# Patient Record
Sex: Male | Born: 1990 | Race: White | Hispanic: No | Marital: Single | Smoking: Never smoker
Health system: Southern US, Community
[De-identification: ages and names within clinical notes are randomized; demographics above are authoritative.]

## PROBLEM LIST (undated history)

## (undated) HISTORY — PX: TONSILLECTOMY: SUR1361

---
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
## 2012-10-31 ENCOUNTER — Emergency Department (HOSPITAL_COMMUNITY)
Admission: EM | Admit: 2012-10-31 | Discharge: 2012-10-31 | Disposition: A | Discharge status: Home / Self Care | Payer: Self-pay | Attending: Emergency Medicine | Admitting: Emergency Medicine

## 2012-10-31 ENCOUNTER — Emergency Department (HOSPITAL_COMMUNITY): Payer: Self-pay

## 2012-10-31 ENCOUNTER — Encounter (HOSPITAL_COMMUNITY): Payer: Self-pay | Admitting: *Deleted

## 2012-10-31 ENCOUNTER — Telehealth (HOSPITAL_COMMUNITY): Payer: Self-pay | Admitting: Emergency Medicine

## 2012-10-31 DIAGNOSIS — R197 Diarrhea, unspecified: Secondary | ICD-10-CM | POA: Insufficient documentation

## 2012-10-31 DIAGNOSIS — R1031 Right lower quadrant pain: Secondary | ICD-10-CM | POA: Insufficient documentation

## 2012-10-31 DIAGNOSIS — R112 Nausea with vomiting, unspecified: Secondary | ICD-10-CM | POA: Insufficient documentation

## 2012-10-31 DIAGNOSIS — R109 Unspecified abdominal pain: Secondary | ICD-10-CM

## 2012-10-31 LAB — COMPREHENSIVE METABOLIC PANEL
Alkaline Phosphatase: 69 U/L (ref 39–117)
BUN: 17 mg/dL (ref 6–23)
Chloride: 103 mEq/L (ref 96–112)
GFR calc Af Amer: >90 mL/min (ref >90)
GFR calc non Af Amer: >90 mL/min (ref >90)
Glucose, Bld: 92 mg/dL (ref 70–99)
Potassium: 3.9 mEq/L (ref 3.5–5.1)
Total Bilirubin: 0.3 mg/dL (ref 0.3–1.2)

## 2012-10-31 LAB — CBC WITH DIFFERENTIAL/PLATELET
HCT: 42.6 % (ref 39.0–52.0)
Hemoglobin: 15 g/dL (ref 13.0–17.0)
Lymphs Abs: 1.9 10*3/uL (ref 0.7–4.0)
MCH: 29.9 pg (ref 26.0–34.0)
Monocytes Absolute: 1.1 10*3/uL — ABNORMAL HIGH (ref 0.1–1.0)
Monocytes Relative: 7 % (ref 3–12)
Neutro Abs: 12.6 10*3/uL — ABNORMAL HIGH (ref 1.7–7.7)
Neutrophils Relative %: 79 % — ABNORMAL HIGH (ref 43–77)
RBC: 5.01 MIL/uL (ref 4.22–5.81)

## 2012-10-31 LAB — URINALYSIS, MICROSCOPIC ONLY
Ketones, ur: NEGATIVE mg/dL
Leukocytes, UA: NEGATIVE
Nitrite: NEGATIVE
Protein, ur: NEGATIVE mg/dL
Urobilinogen, UA: 0.2 mg/dL (ref 0.0–1.0)

## 2012-10-31 IMAGING — CT CT ABD-PELV W/ CM
2 of 4 series · 15 of 46 positions shown, 17 images · IV contrast (APPLIED)
Comparison: [DATE]

CLINICAL DATA: Right lower quadrant pain

CT ABDOMEN AND PELVIS WITH CONTRAST
TECHNIQUE: Multidetector CT imaging of the abdomen and pelvis was
performed following the standard protocol during bolus
administration of intravenous contrast.
Contrast: 100mL OMNIPAQUE IOHEXOL 300 MG/ML  SOLN, 50mL OMNIPAQUE
IOHEXOL 300 MG/ML  SOLN

[Series 2: abd/pelv with 5.0 b31f st · axial · 0.65mm/px · z∈[+753,+1108]mm · 12 of 85 slices shown, 14 images]
[im 7/85  soft-tissue]
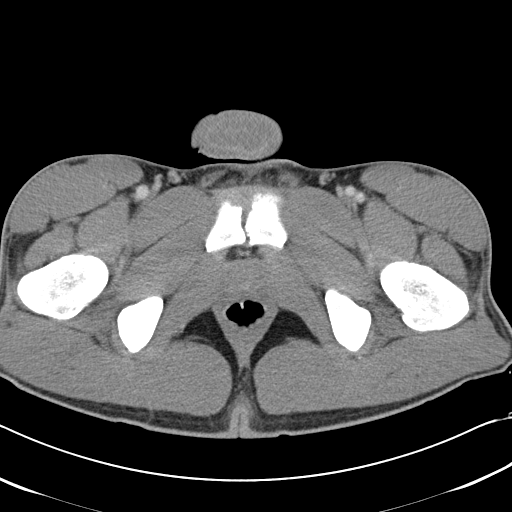
[im 7/85  bone]
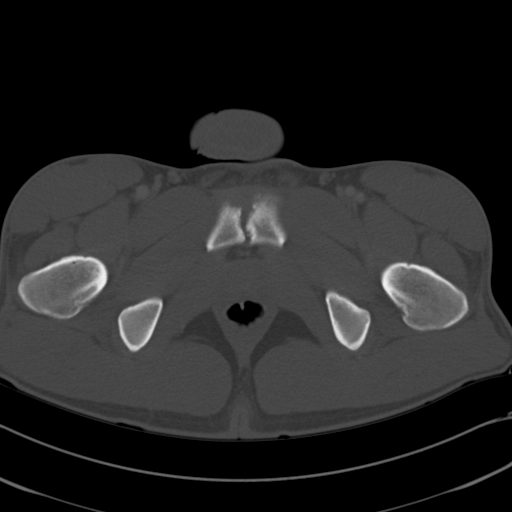
[im 13/85  soft-tissue]
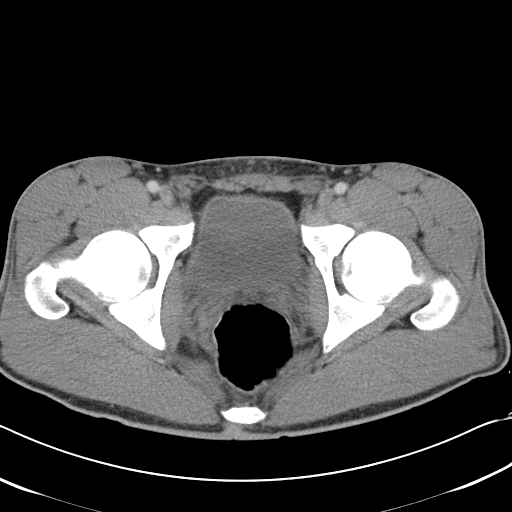
[im 20/85  soft-tissue]
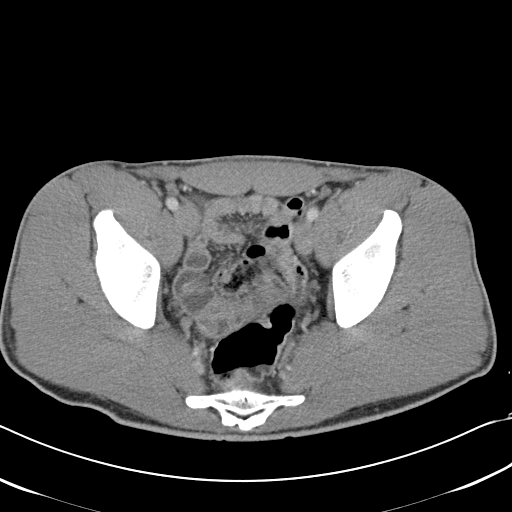
[im 26/85  soft-tissue]
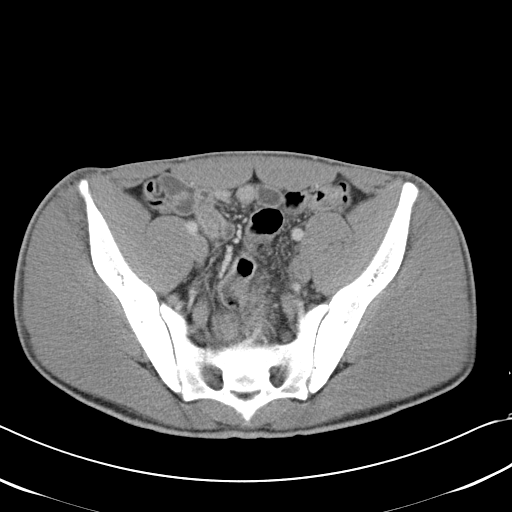
[im 33/85  soft-tissue]
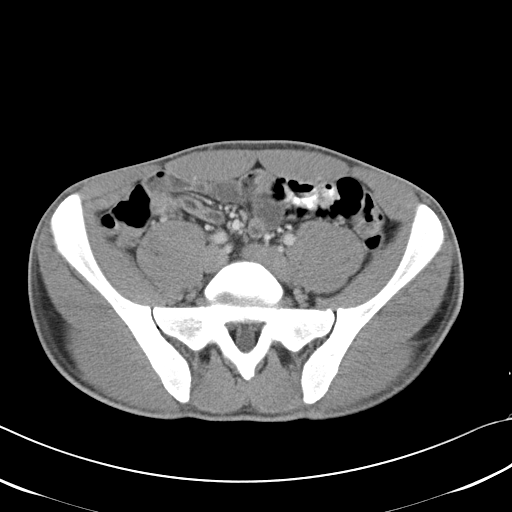
[im 39/85  soft-tissue]
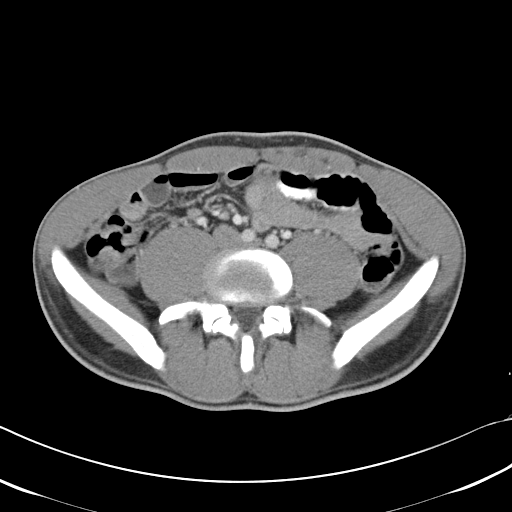
[im 46/85  soft-tissue]
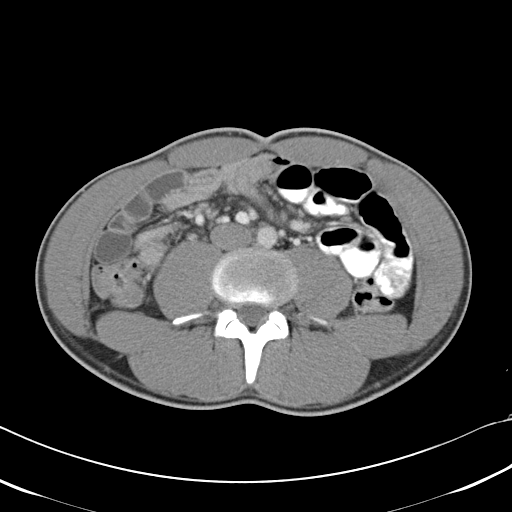
[im 52/85  soft-tissue]
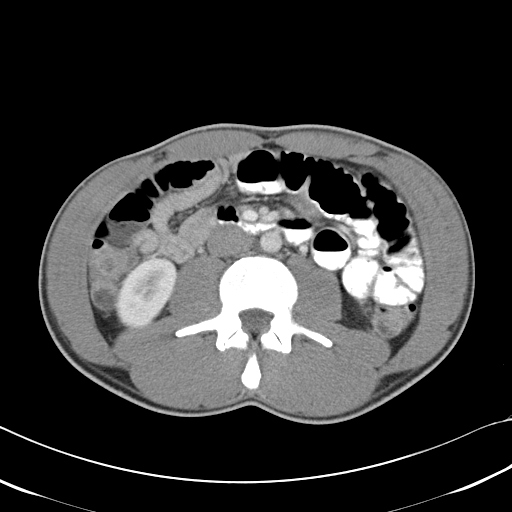
[im 59/85  soft-tissue]
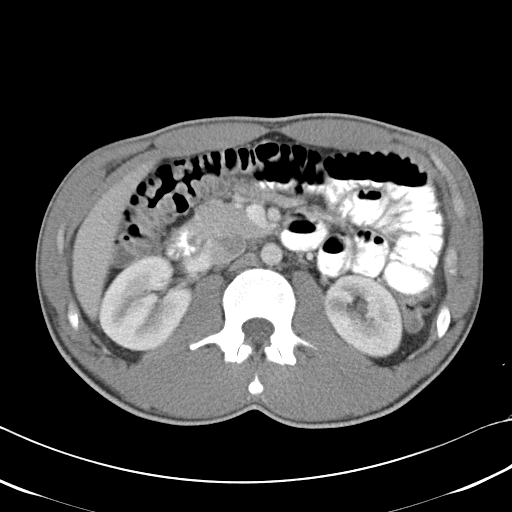
[im 59/85  bone]
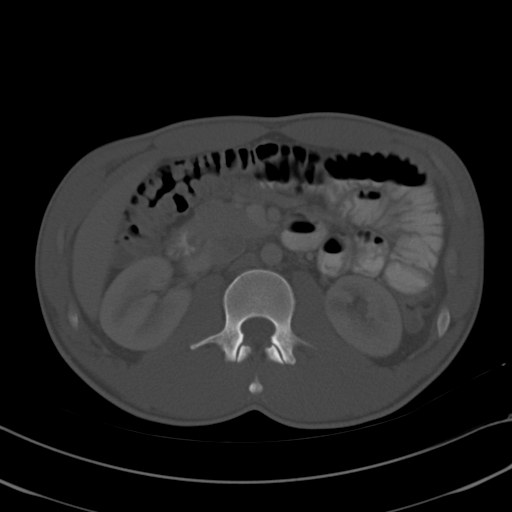
[im 65/85  soft-tissue]
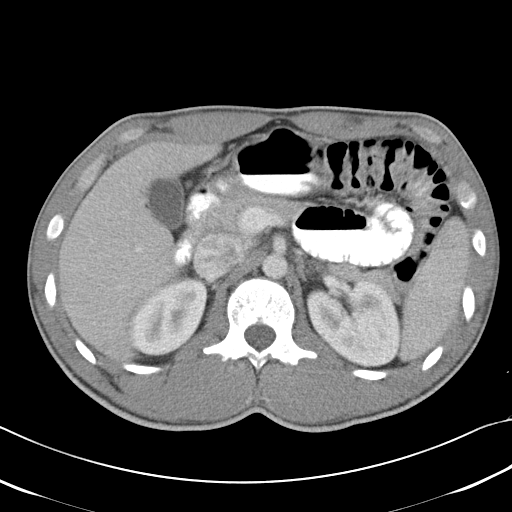
[im 72/85  soft-tissue]
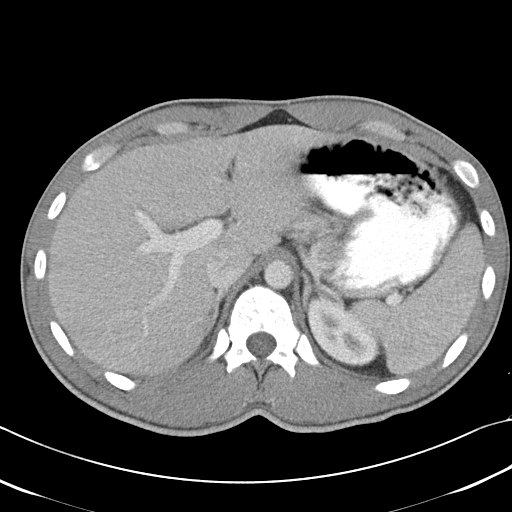
[im 78/85  soft-tissue]
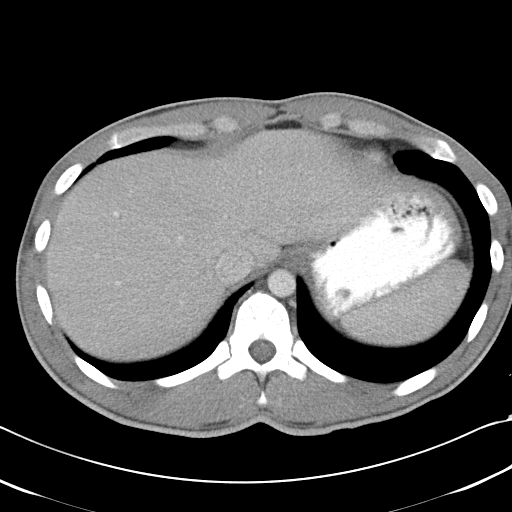

[Series 5: coronals · coronal · 0.50mm/px · 3 of 101 slices shown]
[im 34/101  soft-tissue]
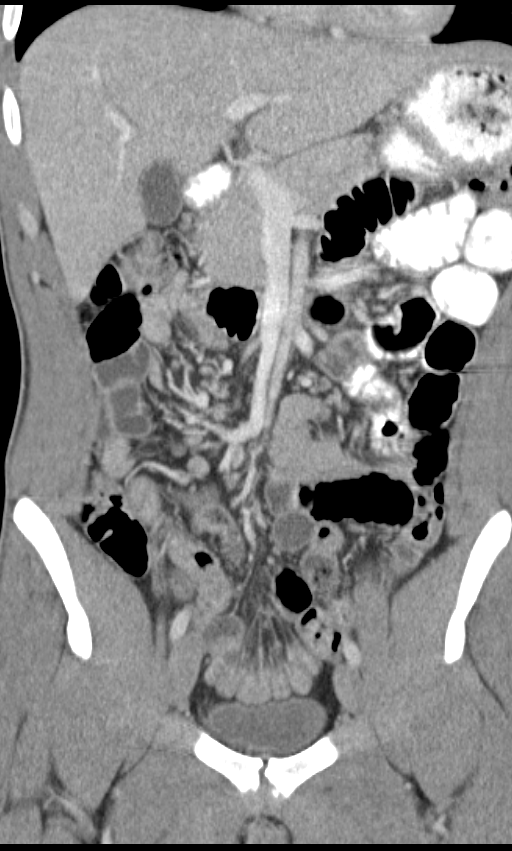
[im 45/101  soft-tissue]
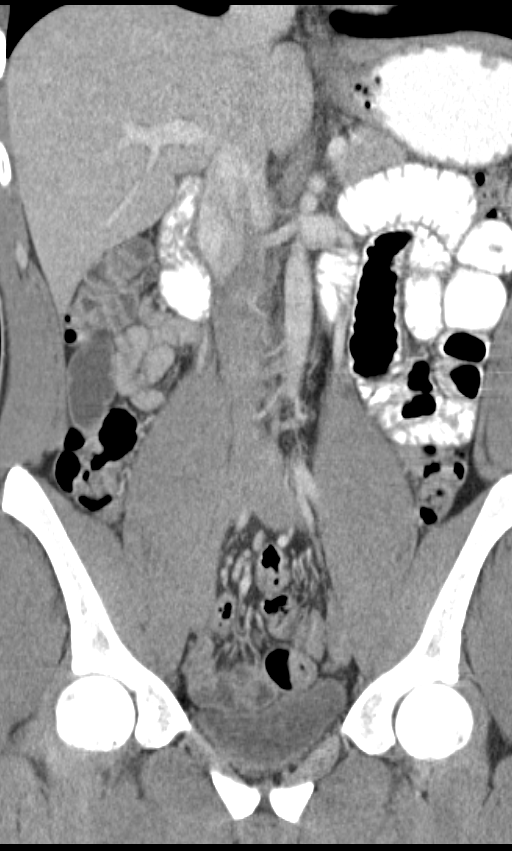
[im 56/101  soft-tissue]
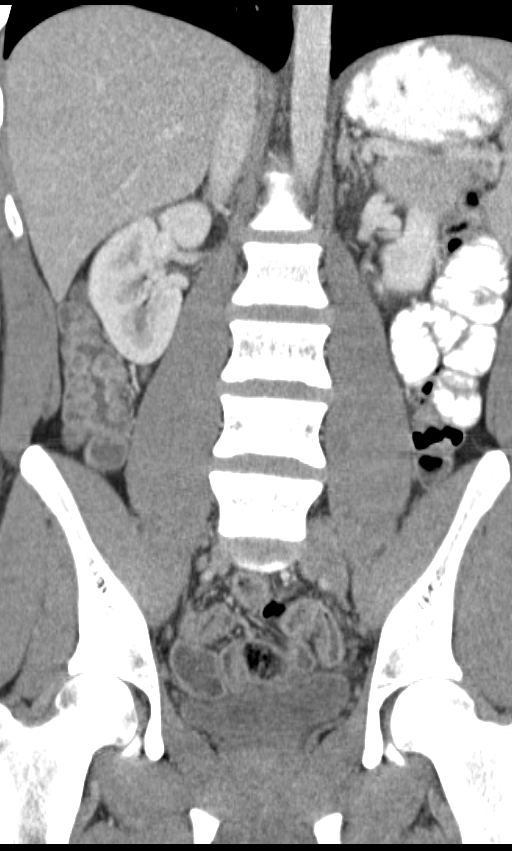

[15 of 46 positions shown; findings below may reference images not displayed]

FINDINGS: Lung bases are clear.  Hepatic dome excluded from the
study.  Otherwise, unremarkable liver, biliary system, spleen,
pancreas, adrenal glands.

Interpolar left renal cysts appears mildly complex cyst with thin
septation.  Otherwise, symmetric renal enhancement.  No
hydronephrosis or hydroureter.

No CT evidence for colitis.  Normal appendix.  Small bowel loops
are normal course and caliber with a couple nonspecific air-fluid
levels.  No perienteric fat stranding, wall thickening, or bowel
wall pneumatosis.

No free intraperitoneal air or fluid.  No lymphadenopathy.

Normal caliber aorta and branch vessels.

Partially decompressed bladder.

No acute osseous finding.
IMPRESSION: There are a few nonspecific small bowel air-fluid levels, as can be
seen with gastroenteritis.

Normal appendix.

Mildly complex left renal cyst is similar to prior.

## 2012-10-31 MED ORDER — IOHEXOL 300 MG/ML  SOLN
50.0000 mL | Freq: Once | INTRAMUSCULAR | Status: AC | PRN
  Administered 2012-10-31: 50 mL via ORAL

## 2012-10-31 MED ORDER — SODIUM CHLORIDE 0.9 % IV SOLN
INTRAVENOUS | Status: DC
  Administered 2012-10-31: 04:00:00 via INTRAVENOUS

## 2012-10-31 MED ORDER — IOHEXOL 300 MG/ML  SOLN
100.0000 mL | Freq: Once | INTRAMUSCULAR | Status: AC | PRN
  Administered 2012-10-31: 100 mL via INTRAVENOUS

## 2012-10-31 MED ORDER — MORPHINE SULFATE 4 MG/ML IJ SOLN
4.0000 mg | INTRAMUSCULAR | Status: DC | PRN
  Administered 2012-10-31: 4 mg via INTRAVENOUS
  Filled 2012-10-31: qty 1

## 2012-10-31 MED ORDER — ONDANSETRON HCL 4 MG/2ML IJ SOLN
4.0000 mg | Freq: Once | INTRAMUSCULAR | Status: AC
  Administered 2012-10-31: 4 mg via INTRAVENOUS
  Filled 2012-10-31: qty 2

## 2012-10-31 MED ORDER — PROMETHAZINE HCL 25 MG PO TABS: 25.0000 mg | ORAL_TABLET | Freq: Four times a day (QID) | ORAL | Status: DC | PRN

## 2012-10-31 MED ORDER — IBUPROFEN 800 MG PO TABS: 800.0000 mg | ORAL_TABLET | Freq: Three times a day (TID) | ORAL | Status: DC

## 2012-10-31 NOTE — ED Notes (Signed)
Pt comfortable with dc instructions, given prescriptions x2.

## 2012-10-31 NOTE — ED Provider Notes (Signed)
History     CSN: [id number]  Arrival date & time 10/31/12  0258   First MD Initiated Contact with Patient 10/31/12 [phone]      Chief Complaint  Patient presents with  . Abdominal Pain    (Consider location/radiation/quality/duration/timing/severity/associated sxs/prior treatment) HPI Hx per PT. ABD pain onset tonight, pain located RLQ, no trauma. N/V today today. Pain moderate in severity, worse with movement, better laying still.  Last meal 10pm.  No blood in emesis. No known sick contacts.  quality of pain is sharp History reviewed. No pertinent past medical history.  Past Surgical History  Procedure Laterality Date  . Tonsillectomy      History reviewed. No pertinent family history.  History  Substance Use Topics  . Smoking status: Never Smoker   . Smokeless tobacco: Not on file  . Alcohol Use: No      Review of Systems  Constitutional: Negative for fever and chills.  HENT: Negative for neck pain and neck stiffness.   Eyes: Negative for pain.  Respiratory: Negative for shortness of breath.   Cardiovascular: Negative for chest pain.  Gastrointestinal: Positive for nausea, vomiting and abdominal pain. Negative for blood in stool.  Genitourinary: Negative for dysuria.  Musculoskeletal: Negative for back pain.  Skin: Negative for rash.  Neurological: Negative for headaches.  All other systems reviewed and are negative.    Allergies  Review of patient's allergies indicates no known allergies.  Home Medications  No current outpatient prescriptions on file.  BP 114/74  Pulse 75  Resp 16  SpO2 99%  Physical Exam  Constitutional: He is oriented to person, place, and time. He appears well-developed and well-nourished.  HENT:  Head: Normocephalic and atraumatic.  Eyes: EOM are normal. Pupils are equal, round, and reactive to light. No scleral icterus.  Neck: Neck supple.  Cardiovascular: Normal rate, regular rhythm and intact distal pulses.   Pulmonary/Chest:  Effort normal and breath sounds normal. No respiratory distress. He exhibits no tenderness.  Abdominal: Soft. Bowel sounds are normal. He exhibits no distension.  TTP RLQ  Musculoskeletal: Normal range of motion. He exhibits no edema.  Neurological: He is alert and oriented to person, place, and time.  Skin: Skin is warm and dry.    ED Course  Procedures (including critical care time)  Results for orders placed during the hospital encounter of 10/31/12  CBC WITH DIFFERENTIAL      Result Value Range   WBC 15.9 (*) 4.0 - 10.5 K/uL   RBC 5.01  4.22 - 5.81 MIL/uL   Hemoglobin 15.0  13.0 - 17.0 g/dL   HCT 13.0  86.5 - 78.4 %   MCV 85.0  78.0 - 100.0 fL   MCH 29.9  26.0 - 34.0 pg   MCHC 35.2  30.0 - 36.0 g/dL   RDW 69.6  29.5 - 28.4 %   Platelets 172  150 - 400 K/uL   Neutrophils Relative 79 (*) 43 - 77 %   Neutro Abs 12.6 (*) 1.7 - 7.7 K/uL   Lymphocytes Relative 12  12 - 46 %   Lymphs Abs 1.9  0.7 - 4.0 K/uL   Monocytes Relative 7  3 - 12 %   Monocytes Absolute 1.1 (*) 0.1 - 1.0 K/uL   Eosinophils Relative 1  0 - 5 %   Eosinophils Absolute 0.2  0.0 - 0.7 K/uL   Basophils Relative 0  0 - 1 %   Basophils Absolute 0.0  0.0 - 0.1 K/uL  COMPREHENSIVE  METABOLIC PANEL      Result Value Range   Sodium 138  135 - 145 mEq/L   Potassium 3.9  3.5 - 5.1 mEq/L   Chloride 103  96 - 112 mEq/L   CO2 27  19 - 32 mEq/L   Glucose, Bld 92  70 - 99 mg/dL   BUN 17  6 - 23 mg/dL   Creatinine, Ser 8.46  0.50 - 1.35 mg/dL   Calcium 9.4  8.4 - 96.2 mg/dL   Total Protein 7.3  6.0 - 8.3 g/dL   Albumin 4.1  3.5 - 5.2 g/dL   AST 14  0 - 37 U/L   ALT 10  0 - 53 U/L   Alkaline Phosphatase 69  39 - 117 U/L   Total Bilirubin 0.3  0.3 - 1.2 mg/dL   GFR calc non Af Amer >90  >90 mL/min   GFR calc Af Amer >90  >90 mL/min  URINALYSIS, MICROSCOPIC ONLY      Result Value Range   Color, Urine YELLOW  YELLOW   APPearance CLEAR  CLEAR   Specific Gravity, Urine 1.038 (*) 1.005 - 1.030   pH 5.0  5.0 - 8.0    Glucose, UA NEGATIVE  NEGATIVE mg/dL   Hgb urine dipstick NEGATIVE  NEGATIVE   Bilirubin Urine NEGATIVE  NEGATIVE   Ketones, ur NEGATIVE  NEGATIVE mg/dL   Protein, ur NEGATIVE  NEGATIVE mg/dL   Urobilinogen, UA 0.2  0.0 - 1.0 mg/dL   Nitrite NEGATIVE  NEGATIVE   Leukocytes, UA NEGATIVE  NEGATIVE   WBC, UA 11-20  <3 WBC/hpf   Bacteria, UA FEW (*) RARE   Squamous Epithelial / LPF RARE  RARE   Crystals CA OXALATE CRYSTALS (*) NEGATIVE   Ct Abdomen Pelvis W Contrast  10/31/2012  *RADIOLOGY REPORT*  Clinical Data: Right lower quadrant pain  CT ABDOMEN AND PELVIS WITH CONTRAST  Technique:  Multidetector CT imaging of the abdomen and pelvis was performed following the standard protocol during bolus administration of intravenous contrast.  Contrast: OMNIPAQUE IOHEXOL 300 MG/ML  SOLN, 50mL OMNIPAQUE IOHEXOL 300 MG/ML  SOLN  Comparison: 12/31/2007  Findings: Lung bases are clear.  Hepatic dome excluded from the study.  Otherwise, unremarkable liver, biliary system, spleen, pancreas, adrenal glands.  Interpolar left renal cysts appears mildly complex cyst with thin septation.  Otherwise, symmetric renal enhancement.  No hydronephrosis or hydroureter.  No CT evidence for colitis.  Normal appendix.  Small bowel loops are normal course and caliber with a couple nonspecific air-fluid levels.  No perienteric fat stranding, wall thickening, or bowel wall pneumatosis.  No free intraperitoneal air or fluid.  No lymphadenopathy.  Normal caliber aorta and branch vessels.  Partially decompressed bladder.  No acute osseous finding.  IMPRESSION: There are a few nonspecific small bowel air-fluid levels, as can be seen with gastroenteritis.  Normal appendix.  Mildly complex left renal cyst is similar to prior.   Original Report Authenticated By: Jearld Lesch, M.D.    IVFs, IV morphine, IV Zofran  Recheck after CT scan medications is feeling much better. No emesis in the ED and nausea resolved. Plan discharge home  with prescription for Phenergan and Motrin and outpatient followup as needed for any persistent or worsening symptoms.   MDM  Nausea vomiting diarrhea with right lower quadrant pain and tenderness. Evaluated with urinalysis, imaging and labs obtained and reviewed as above.  Condition improved with IV fluids and IV morphine and IV Zofran  Vital  signs nursing notes reviewed.      Sunnie Nielsen, MD 11/01/12 [phone]

## 2012-10-31 NOTE — ED Notes (Signed)
Pt c/o diarrhea since yesterday.  Today, began having n/v and abdominal pain.  Last vomited 10 min PTA.

## 2012-11-01 LAB — URINE CULTURE

## 2013-02-19 ENCOUNTER — Encounter (HOSPITAL_COMMUNITY): Payer: Self-pay | Admitting: Emergency Medicine

## 2013-02-19 ENCOUNTER — Emergency Department (HOSPITAL_COMMUNITY)
Admission: EM | Admit: 2013-02-19 | Discharge: 2013-02-19 | Disposition: A | Discharge status: Home / Self Care | Payer: Self-pay | Attending: Emergency Medicine | Admitting: Emergency Medicine

## 2013-02-19 ENCOUNTER — Emergency Department (HOSPITAL_COMMUNITY): Payer: Self-pay

## 2013-02-19 DIAGNOSIS — S7002XA Contusion of left hip, initial encounter: Secondary | ICD-10-CM

## 2013-02-19 DIAGNOSIS — Z791 Long term (current) use of non-steroidal anti-inflammatories (NSAID): Secondary | ICD-10-CM | POA: Insufficient documentation

## 2013-02-19 DIAGNOSIS — IMO0002 Reserved for concepts with insufficient information to code with codable children: Secondary | ICD-10-CM | POA: Insufficient documentation

## 2013-02-19 DIAGNOSIS — Y9389 Activity, other specified: Secondary | ICD-10-CM | POA: Insufficient documentation

## 2013-02-19 DIAGNOSIS — S838X9A Sprain of other specified parts of unspecified knee, initial encounter: Secondary | ICD-10-CM | POA: Insufficient documentation

## 2013-02-19 DIAGNOSIS — S76112A Strain of left quadriceps muscle, fascia and tendon, initial encounter: Secondary | ICD-10-CM

## 2013-02-19 DIAGNOSIS — Y92410 Unspecified street and highway as the place of occurrence of the external cause: Secondary | ICD-10-CM | POA: Insufficient documentation

## 2013-02-19 DIAGNOSIS — S7000XA Contusion of unspecified hip, initial encounter: Secondary | ICD-10-CM | POA: Insufficient documentation

## 2013-02-19 IMAGING — CR DG HIP (WITH OR WITHOUT PELVIS) 2-3V*L*
2 series · 2 of 2 positions shown · non-contrast
Comparison: None.

CLINICAL DATA: lateral hip pain post injury last night

LEFT HIP - COMPLETE 2+ VIEW

[t pelvis a.p.]
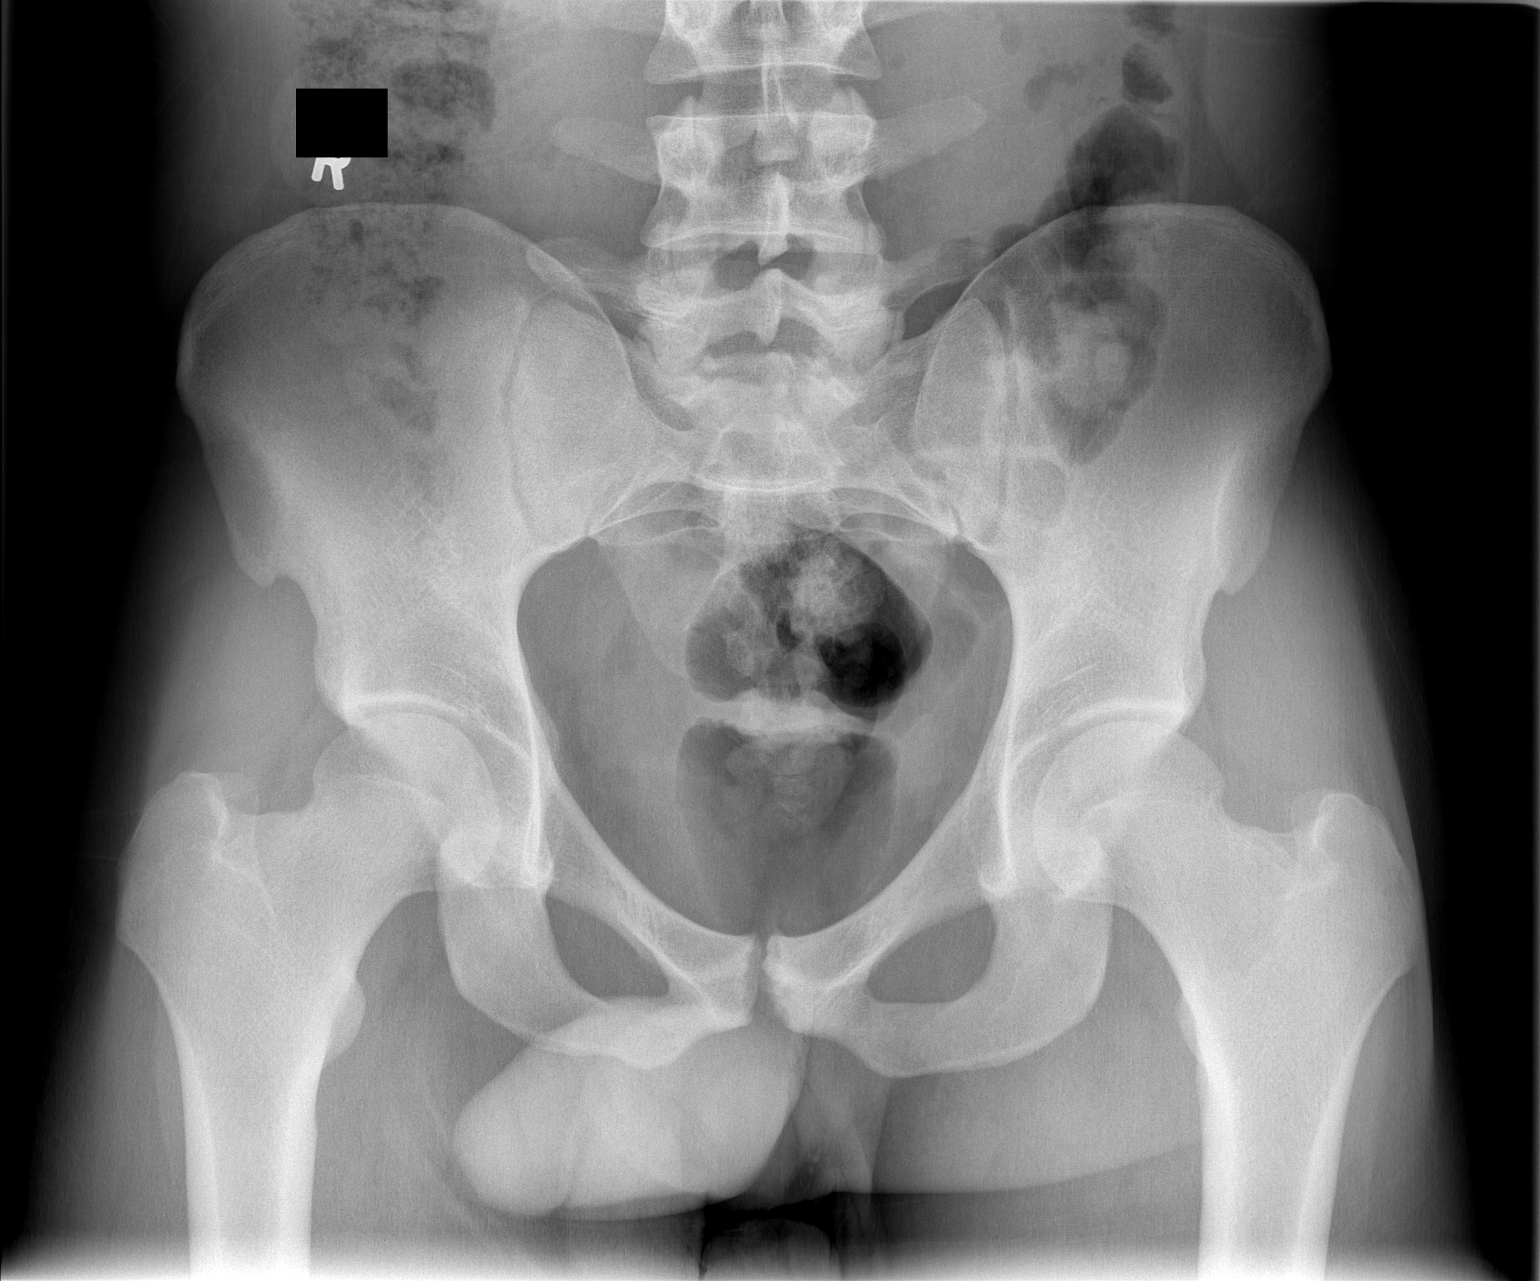

[t hip ap left]
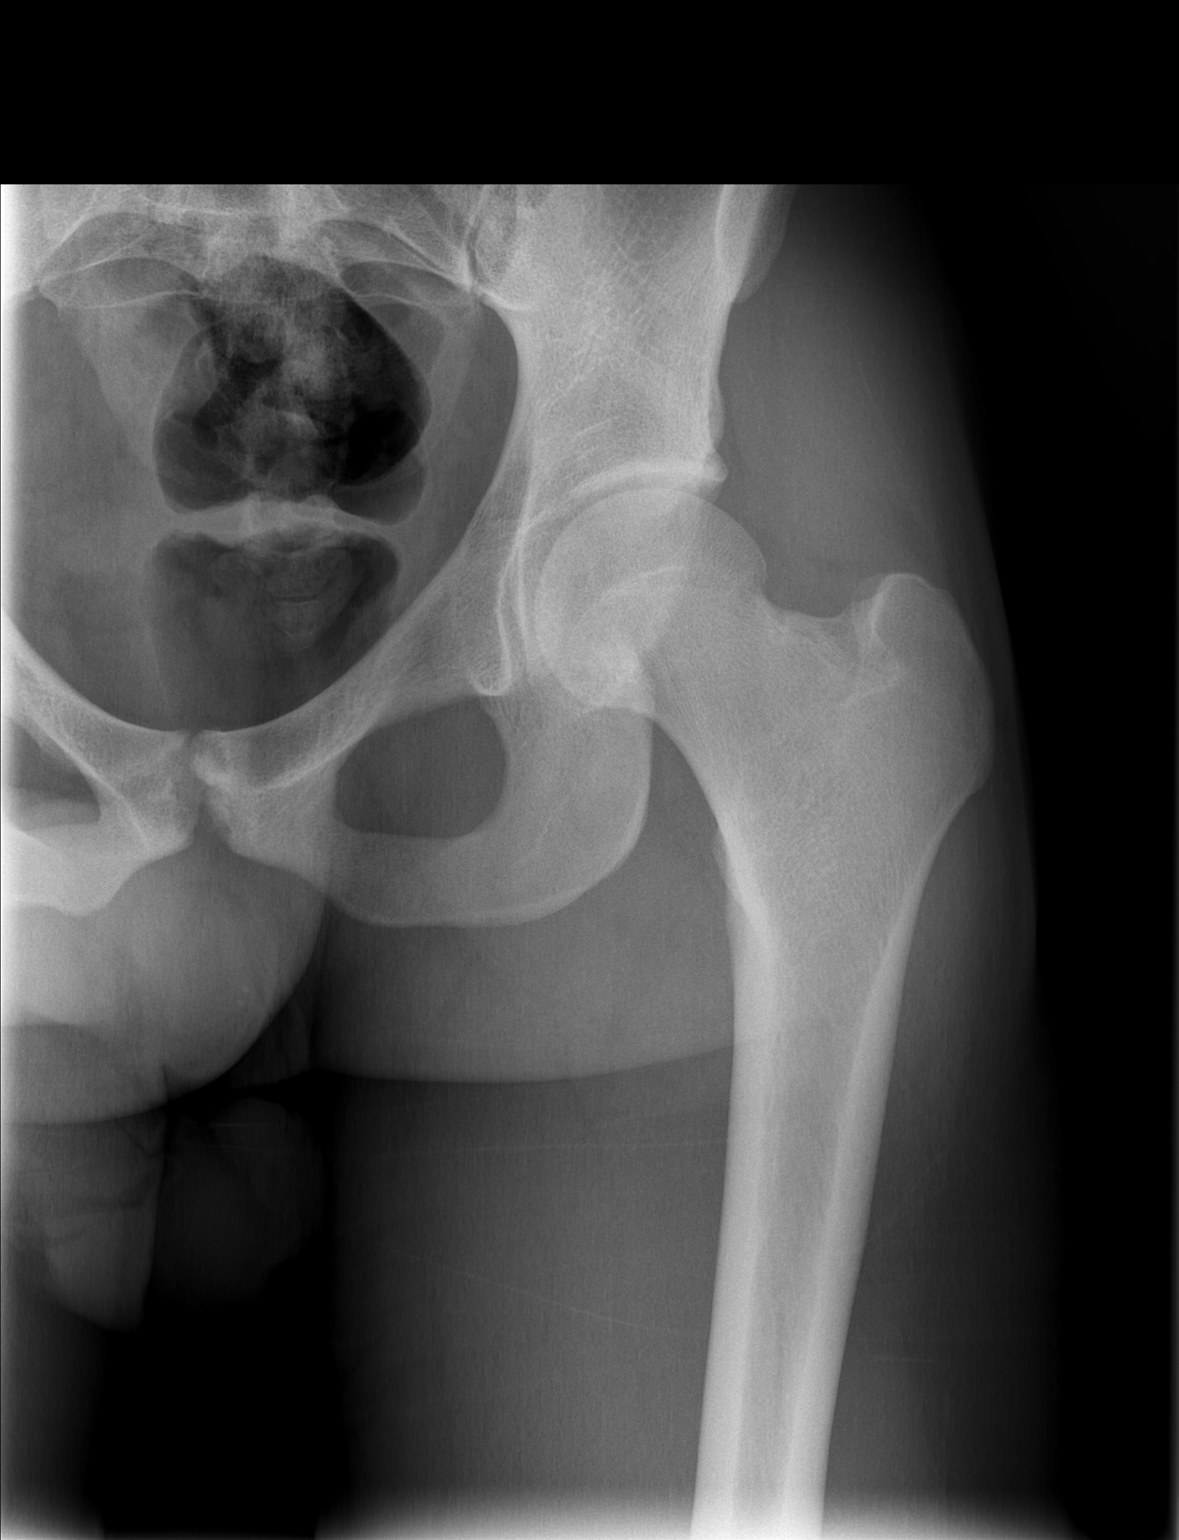

[2 of 2 positions shown; findings below may reference images not displayed]

FINDINGS: Three views of the left hip submitted.  No acute fracture
or subluxation.  No radiopaque foreign body.
IMPRESSION: No acute fracture or subluxation.

## 2013-02-19 MED ORDER — HYDROCODONE-ACETAMINOPHEN 5-325 MG PO TABS: 1.0000 | ORAL_TABLET | Freq: Four times a day (QID) | ORAL | Status: DC | PRN

## 2013-02-19 MED ORDER — NAPROXEN 500 MG PO TABS: 500.0000 mg | ORAL_TABLET | Freq: Two times a day (BID) | ORAL | Status: DC
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
## 2013-02-23 NOTE — ED Provider Notes (Signed)
Medical screening examination/treatment/procedure(s) were performed by non-physician practitioner and as supervising physician I was immediately available for consultation/collaboration.    Gilda Crease, MD 02/23/13 [phone]

## 2016-03-04 ENCOUNTER — Ambulatory Visit (INDEPENDENT_AMBULATORY_CARE_PROVIDER_SITE_OTHER): Payer: Self-pay | Admitting: Nurse Practitioner

## 2016-03-04 DIAGNOSIS — A549 Gonococcal infection, unspecified: Secondary | ICD-10-CM

## 2016-03-04 DIAGNOSIS — Z202 Contact with and (suspected) exposure to infections with a predominantly sexual mode of transmission: Secondary | ICD-10-CM

## 2016-03-04 MED ORDER — DOXYCYCLINE HYCLATE 100 MG PO TABS: 100.0000 mg | ORAL_TABLET | Freq: Two times a day (BID) | ORAL | 0 refills | Status: AC

## 2016-03-04 MED ORDER — CEFTRIAXONE SODIUM 1 G IJ SOLR
250.0000 mg | Freq: Once | INTRAMUSCULAR | Status: AC
  Administered 2016-03-04: 250 mg via INTRAMUSCULAR

## 2016-03-04 MED ORDER — DOXYCYCLINE HYCLATE 100 MG PO TABS: 100.0000 mg | ORAL_TABLET | Freq: Two times a day (BID) | ORAL | 0 refills | Status: DC

## 2016-03-04 NOTE — Patient Instructions (Signed)
Gonorrhea Gonorrhea is an infection that can cause serious problems. If left untreated, the infection may:   Damage the male or male organs.   Cause women to be unable to have children (sterility).   Harm a fetus if the infected woman is pregnant.  It is important to get treatment for gonorrhea as soon as possible. It is also necessary that all your sexual partners be tested for the infection.  CAUSES  Gonorrhea is caused by bacteria called Neisseria gonorrhoeae. The infection is spread from person to person, usually by sexual contact (such as by anal, vaginal, or oral means). A newborn can contract the infection from his or her mother during birth.  RISK FACTORS  Being a woman younger than 23 years of age who is sexually active.  Being a woman 25 years of age or older who has:  A new sex partner.  More than one sex partner.  A sex partner who has a sexually transmitted disease (STD).  Using condoms inconsistently.  Currently having, or having previously had, an STD.  Exchanging sex or money or drugs. SYMPTOMS  Some people with gonorrhea do not have symptoms. Symptoms may be different in females and males.  Females The most common symptoms are:   Pain in the lower abdomen.   Fever with or without chills.  Other symptoms include:   Abnormal vaginal discharge.   Painful intercourse.   Burning or itching of the vagina or lips of the vagina.   Abnormal vaginal bleeding.   Pain when urinating.   Long-lasting (chronic) pain in the lower abdomen, especially during menstruation or intercourse.   Inability to become pregnant.   Going into premature labor.   Irritation, pain, bleeding, or discharge from the rectum. This may occur if the infection was spread by anal sex.   Sore throat or swollen lymph nodes in the neck. This may occur if the infection was spread by oral sex.  Males The most common symptoms are:   Discharge from the penis.   Pain  or burning during urination.   Pain or swelling in the testicles. Other symptoms may include:   Irritation, pain, bleeding, or discharge from the rectum. This may occur if the infection was spread by anal sex.   Sore throat, fever, or swollen lymph nodes in the neck. This may occur if the infection was spread by oral sex.  DIAGNOSIS  A diagnosis is made after a physical exam is done and a sample of discharge is examined under a microscope for the presence of the bacteria. The discharge may be taken from the urethra, cervix, throat, or rectum.  TREATMENT  Gonorrhea is treated with antibiotic medicines. It is important for treatment to begin as soon as possible. Early treatment may prevent some problems from developing. Do not have sex. Avoid all types of sexual activity for 7 days after treatment is complete and until any sex partners have been treated. HOME CARE INSTRUCTIONS   Take medicines only as directed by your health care provider.   Take your antibiotic medicine as directed by your health care provider. Finish the antibiotic even if you start to feel better. Incomplete treatment will put you at risk for continued infection.   Do not have sex until treatment is complete or as directed by your health care provider.   Keep all follow-up visits as directed by your health care provider.   Not all test results are available during your visit. If your test results are not back   during the visit, make an appointment with your health care provider to find out the results. Do not assume everything is normal if you have not heard from your health care provider or the medical facility. It is your responsibility to get your test results.  If you test positive for gonorrhea, inform your recent sexual partners. They need to be checked for gonorrhea even if they do not have symptoms. They may need treatment, even if they test negative for gonorrhea.  SEEK MEDICAL CARE IF:   You develop any  bad reaction to the medicine you were prescribed. This may include:   A rash.   Nausea.   Vomiting.   Diarrhea.   Your symptoms do not improve after a few days of taking antibiotics.   Your symptoms get worse.   You develop increased pain, such as in the testicles (for males) or in the abdomen (for females).  You have a fever. MAKE SURE YOU:   Understand these instructions.  Will watch your condition.  Will get help right away if you are not doing well or get worse.   This information is not intended to replace advice given to you by your health care provider. Make sure you discuss any questions you have with your health care provider.   Document Released: 07/26/2000 Document Revised: 08/19/2014 Document Reviewed: 02/03/2013 Elsevier Interactive Patient Education 2016 [employer]  

## 2016-03-04 NOTE — Progress Notes (Signed)
   Subjective:    Patient ID: Nicholas Spears, male    DOB: 26-Dec-1990, 25 y.o.   MRN: [id number]  HPI  The patient is a 25 y.o. Male that presents for suspected gonorrhea.  The patient states his girlfriend told him approximately one month ago that she was treated for gonorrhea.  The patient is seeking testing and treatment for exposure.  The patient denies fever, chills, weight loss/gain, abd pain, hematuria, penile discharge or dysuria.  The patient does c/o intermittent testicular pain and testicular swelling.  The patient denies sexual activity since he was made aware by his girlfriend or previous STD.      Review of Systems  Constitutional: Negative.   Respiratory: Negative.   Cardiovascular: Negative.   Gastrointestinal: Negative.   Genitourinary: Positive for scrotal swelling and testicular pain. Negative for difficulty urinating, discharge, dysuria, frequency, genital sores, hematuria and penile pain.  Skin: Negative.        Objective:   Physical Exam  Constitutional: He is oriented to person, place, and time. He appears well-developed and well-nourished. No distress.  HENT:  Head: Normocephalic.  Cardiovascular: Normal rate, regular rhythm and normal heart sounds.   Pulmonary/Chest: Effort normal and breath sounds normal.  Abdominal: Soft. Bowel sounds are normal. He exhibits no distension and no mass. There is no tenderness. There is no rebound and no guarding.  Genitourinary: Penis normal.  Genitourinary Comments: Testicular swelling, testicular pain with palpation.  Neurological: He is alert and oriented to person, place, and time.  Skin: Skin is warm and dry.  Psychiatric: His behavior is normal.          Assessment & Plan:  Gonorrhea  The patient was administered Rocephin 250mg  IM in the office.  Chlamydia testing performed, negative.  The patient was instructed regarding the use of condoms and the risks associated with multiple sexual partners. Doxycycline 100mg  BID  x 7 days was prescribed.  The patient was provided education on gonorrhea.

## 2018-12-07 ENCOUNTER — Other Ambulatory Visit: Payer: Self-pay

## 2018-12-07 ENCOUNTER — Emergency Department (HOSPITAL_COMMUNITY): Payer: Self-pay

## 2018-12-07 ENCOUNTER — Emergency Department (HOSPITAL_COMMUNITY)
Admission: EM | Admit: 2018-12-07 | Discharge: 2018-12-07 | Disposition: A | Discharge status: Home / Self Care | Payer: Self-pay | Attending: Emergency Medicine | Admitting: Emergency Medicine

## 2018-12-07 ENCOUNTER — Encounter (HOSPITAL_COMMUNITY): Payer: Self-pay | Admitting: *Deleted

## 2018-12-07 DIAGNOSIS — N451 Epididymitis: Secondary | ICD-10-CM | POA: Insufficient documentation

## 2018-12-07 DIAGNOSIS — N50819 Testicular pain, unspecified: Secondary | ICD-10-CM

## 2018-12-07 DIAGNOSIS — N503 Cyst of epididymis: Secondary | ICD-10-CM | POA: Insufficient documentation

## 2018-12-07 DIAGNOSIS — Z113 Encounter for screening for infections with a predominantly sexual mode of transmission: Secondary | ICD-10-CM | POA: Insufficient documentation

## 2018-12-07 DIAGNOSIS — I861 Scrotal varices: Secondary | ICD-10-CM | POA: Insufficient documentation

## 2018-12-07 LAB — URINALYSIS, ROUTINE W REFLEX MICROSCOPIC
Bilirubin Urine: NEGATIVE
Glucose, UA: NEGATIVE mg/dL
Hgb urine dipstick: NEGATIVE
Ketones, ur: NEGATIVE mg/dL
Leukocytes,Ua: NEGATIVE
Nitrite: NEGATIVE
Protein, ur: NEGATIVE mg/dL
Specific Gravity, Urine: 1.008 (ref 1.005–1.030)
pH: 7 (ref 5.0–8.0)

## 2018-12-07 IMAGING — US ULTRASOUND SCROTUM DOPPLER COMPLETE
1 series · 14 of 25 positions shown · non-contrast
Comparison: None.

CLINICAL DATA: Acute left testicular pain.

EXAM:
SCROTAL ULTRASOUND
DOPPLER ULTRASOUND OF THE TESTICLES
TECHNIQUE: Complete ultrasound examination of the testicles, epididymis, and
other scrotal structures was performed. Color and spectral Doppler
ultrasound were also utilized to evaluate blood flow to the
testicles.

[Series 1: ultrasound scrotum doppler complete · 14 of 60 slices shown]
[im 1/60]
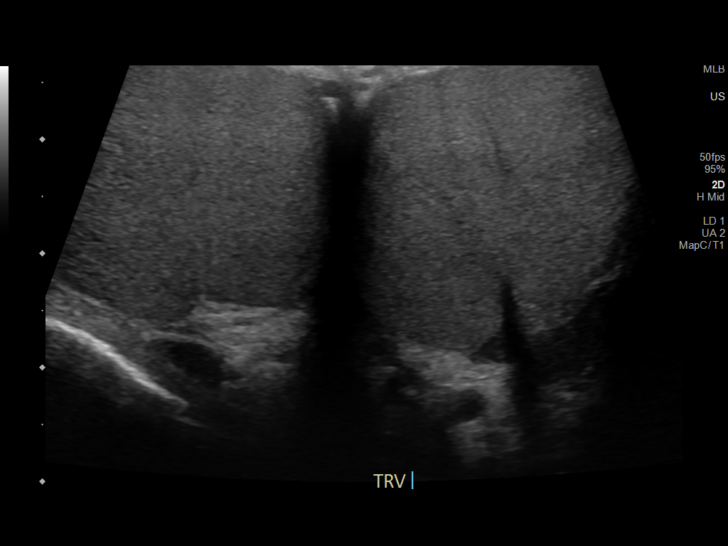
[im 5/60]
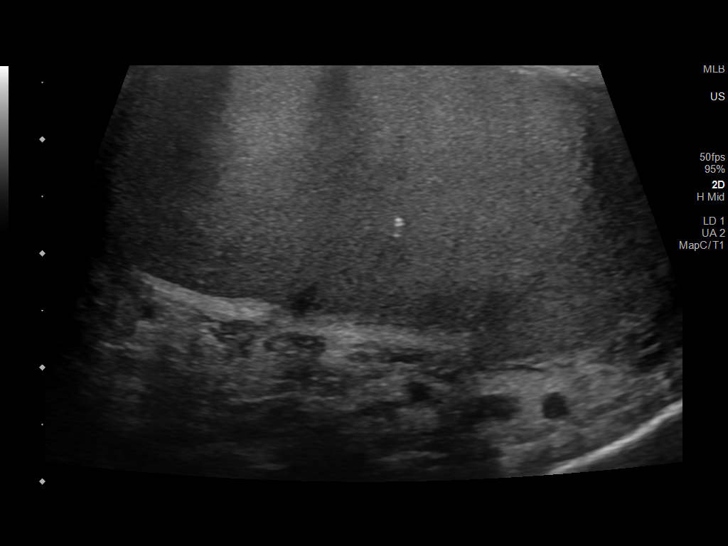
[im 10/60]
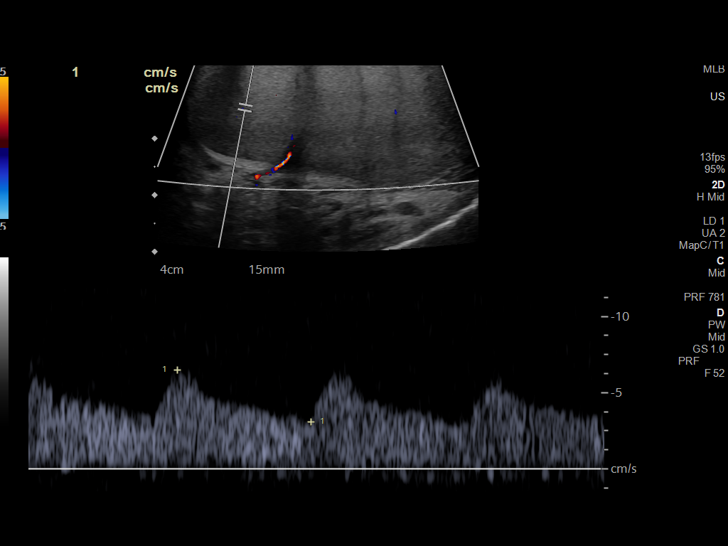
[im 15/60]
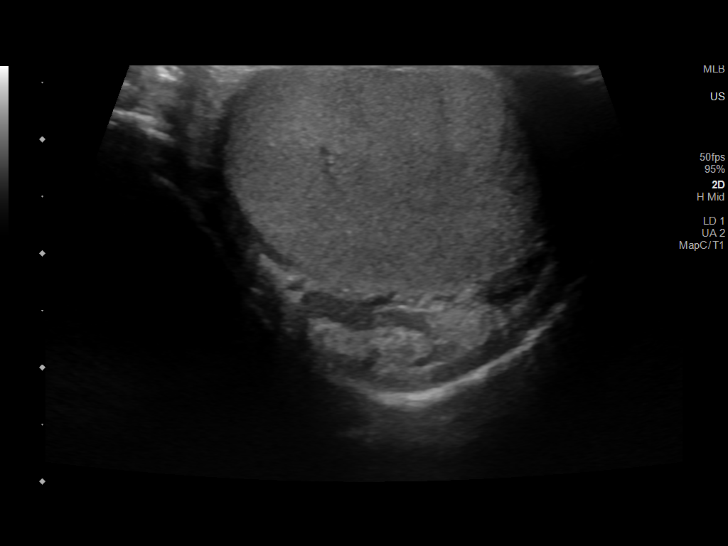
[im 20/60]
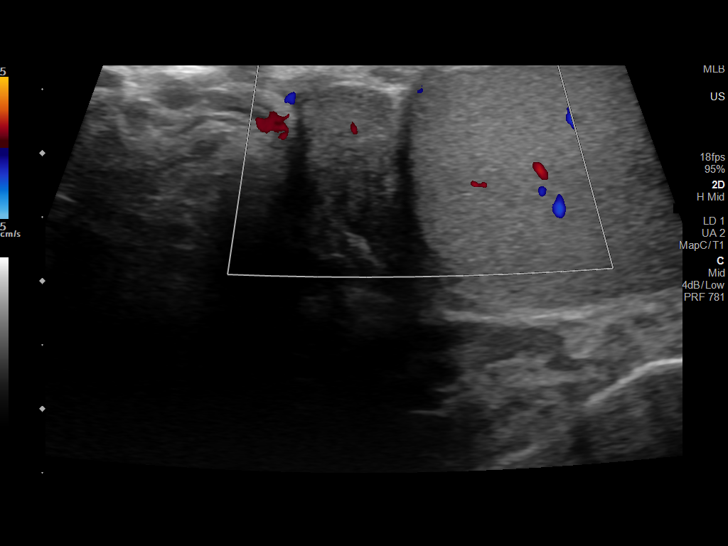
[im 23/60]
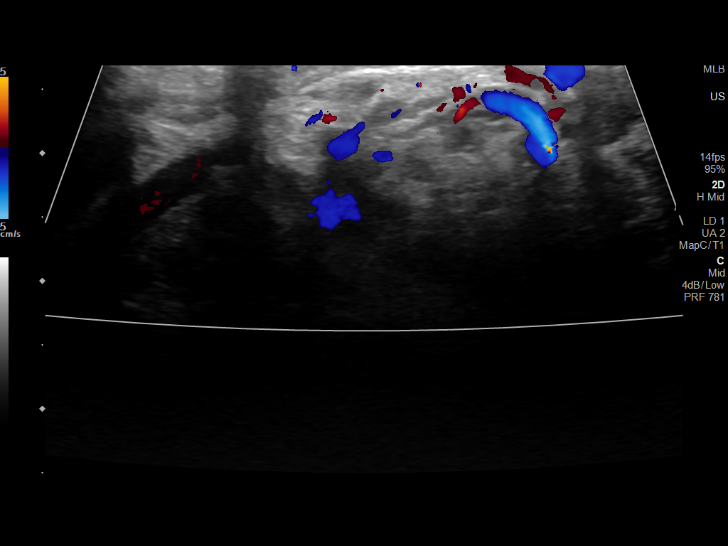
[im 28/60]
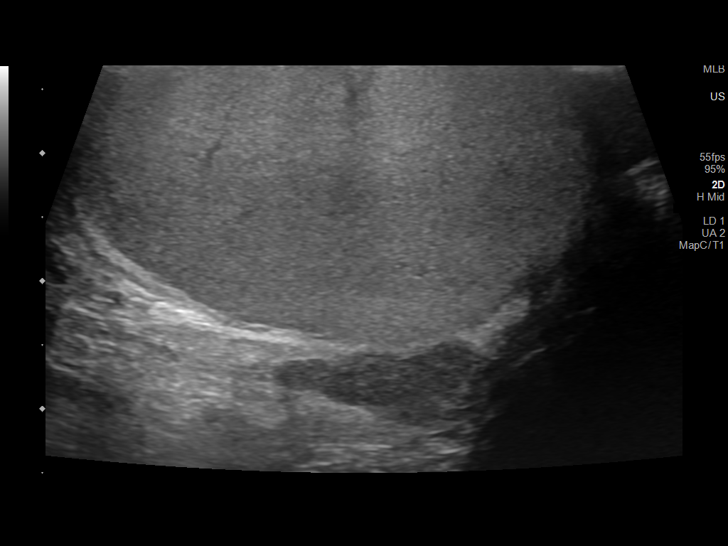
[im 32/60]
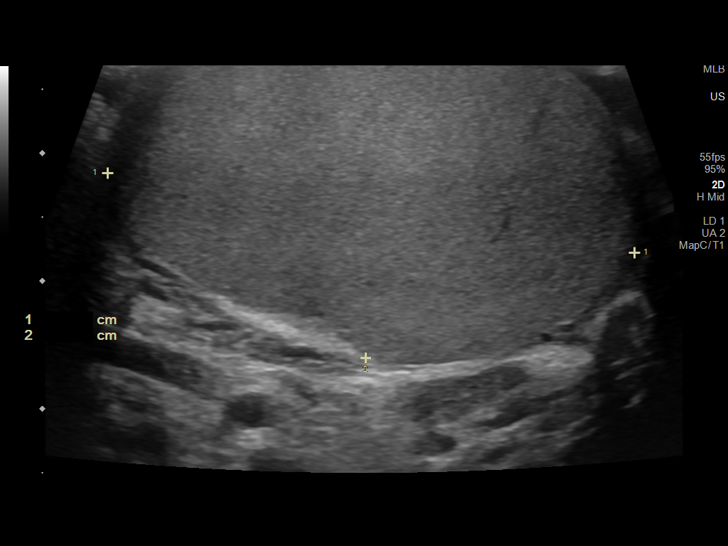
[im 37/60]
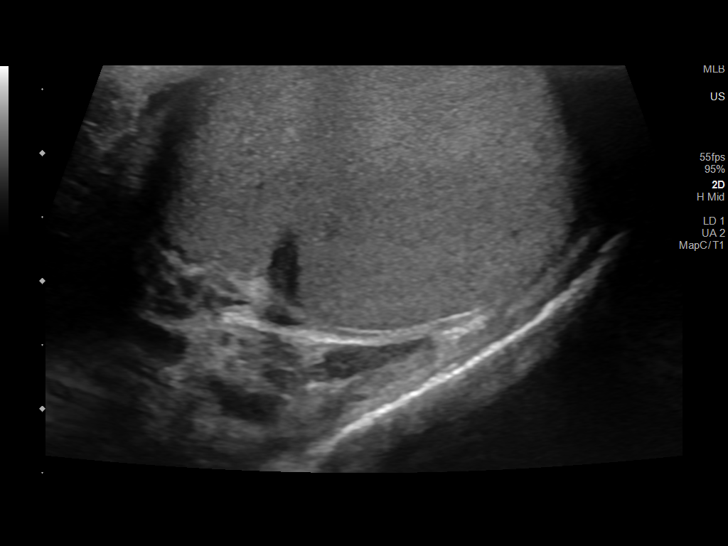
[im 40/60]
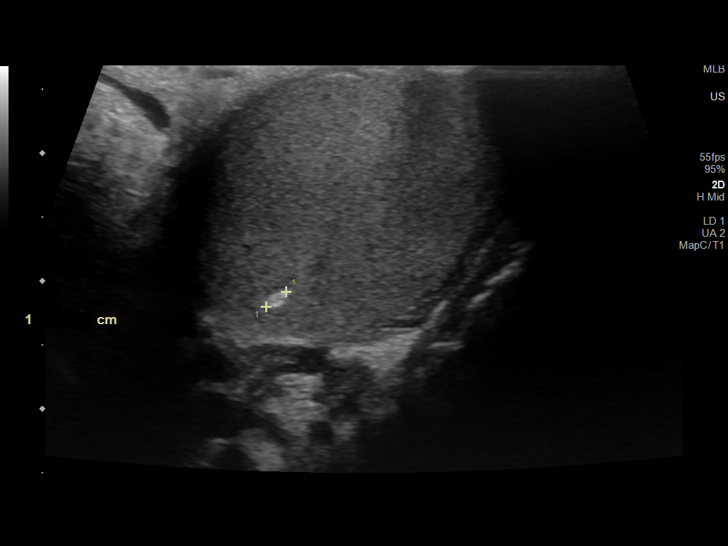
[im 45/60]
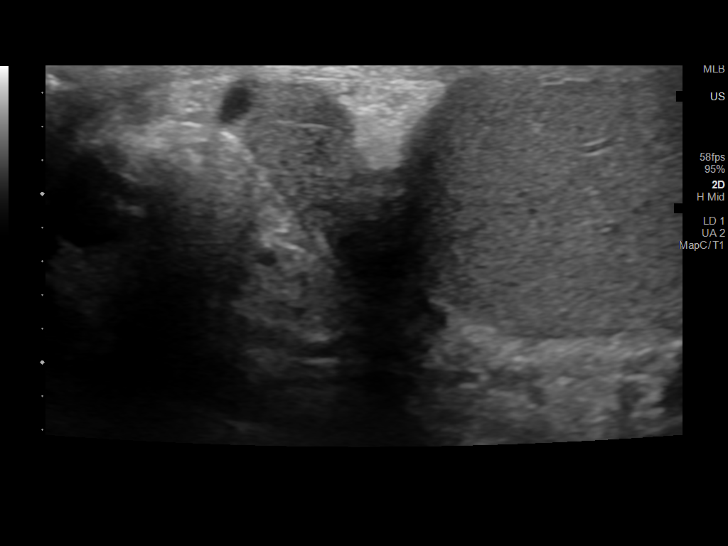
[im 50/60]
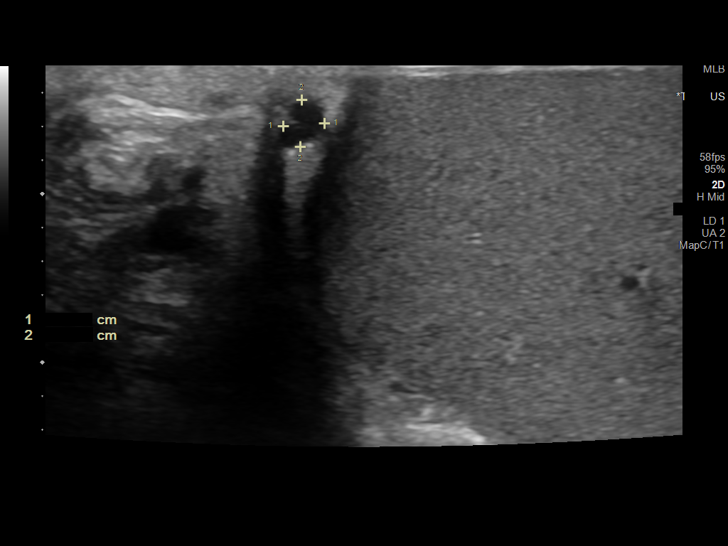
[im 55/60]
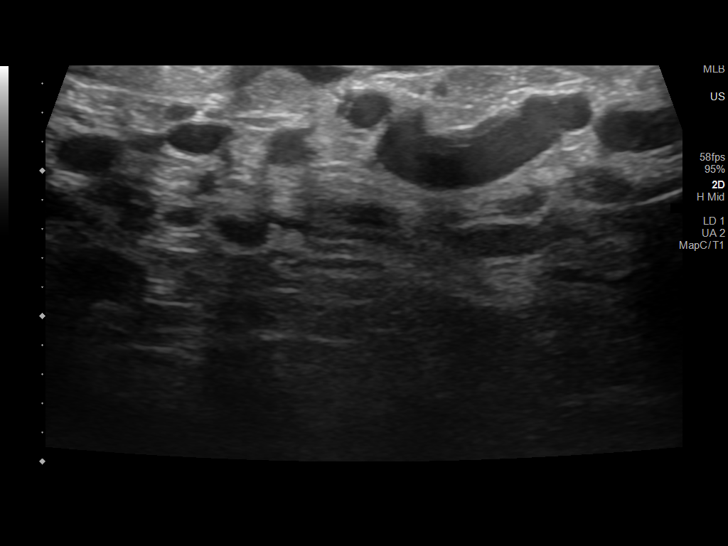
[im 60/60]
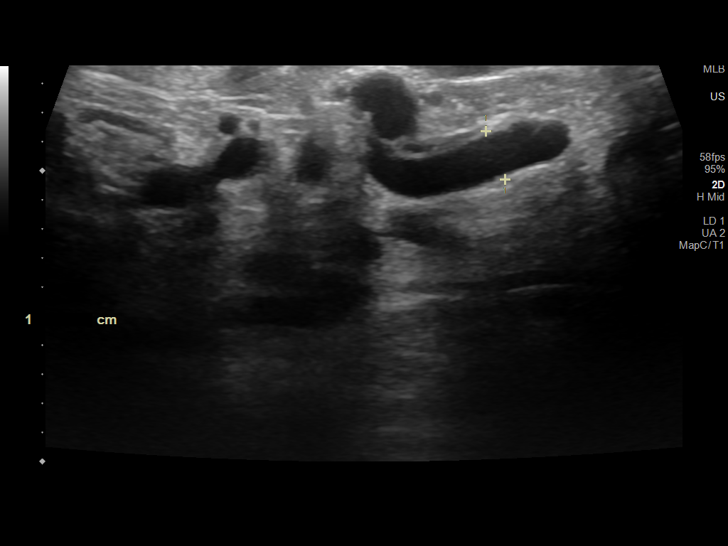

[14 of 25 positions shown; findings below may reference images not displayed]

FINDINGS: Right testicle

Measurements: 5.0 x 3.4 x 2.6 cm. No mass or microlithiasis
visualized.

Left testicle

Measurements: 4.2 x 3.3 x 2.4 cm. No mass or microlithiasis
visualized.

Right epididymis:  Normal in size and appearance.

Left epididymis:  3 mm cyst is noted.

Hydrocele:  None visualized.

Varicocele:  Bilateral varicoceles are noted.

Pulsed Doppler interrogation of both testes demonstrates normal low
resistance arterial and venous waveforms bilaterally.
IMPRESSION: No evidence of testicular mass or torsion. Small left epididymal
cyst. Bilateral varicoceles are noted.

## 2018-12-07 MED ORDER — HYDROCODONE-ACETAMINOPHEN 5-325 MG PO TABS
1.0000 | ORAL_TABLET | Freq: Once | ORAL | Status: DC
  Filled 2018-12-07: qty 1

## 2018-12-07 MED ORDER — HYDROCODONE-ACETAMINOPHEN 5-325 MG PO TABS
1.0000 | ORAL_TABLET | Freq: Once | ORAL | Status: AC
  Administered 2018-12-07: 1 via ORAL
  Filled 2018-12-07: qty 1

## 2018-12-07 MED ORDER — DOXYCYCLINE HYCLATE 100 MG PO CAPS: 100.0000 mg | ORAL_CAPSULE | Freq: Two times a day (BID) | ORAL | 0 refills | Status: AC

## 2018-12-07 MED ORDER — CEFTRIAXONE SODIUM 250 MG IJ SOLR
250.0000 mg | Freq: Once | INTRAMUSCULAR | Status: AC
Start: 2018-12-07 — End: 2018-12-07
  Administered 2018-12-07: 15:00:00 250 mg via INTRAMUSCULAR
  Filled 2018-12-07: qty 250

## 2018-12-07 MED ORDER — DOXYCYCLINE HYCLATE 100 MG PO TABS
100.0000 mg | ORAL_TABLET | Freq: Once | ORAL | Status: AC
  Administered 2018-12-07: 100 mg via ORAL
  Filled 2018-12-07: qty 1

## 2018-12-07 NOTE — ED Notes (Addendum)
Discharge instructions discussed with Pt. Pt verbalized understanding. Pt stable and ambulatory.    

## 2018-12-07 NOTE — ED Provider Notes (Signed)
[facility] Provider Note MRN:  [id number]  Arrival date & time: 12/07/18     Chief Complaint   Testicle Pain (left)   History of Present Illness   Nicholas Spears is a 28 y.o. year-old male with no pertinent past medical history presenting to the ED with chief complaint of testicle pain.  Pain began last night while in bed at about 11 Spears, sudden onset, has been constant.  Moderate in severity, worse with palpation of the left testicle.  Denies fever, no cough, no chest pain, shortness of breath, no abdominal pain, no trauma to the area, no rash, no discharge.  Has had STDs in the past, not sexually active for the past month, but has not been tested for STDs for the past 4 months.  No other exacerbating relieving factors.  Review of Systems  A complete 10 system review of systems was obtained and all systems are negative except as noted in the HPI and PMH.   Patient's Health History   History reviewed. No pertinent past medical history.  History reviewed. No pertinent surgical history.  History reviewed. No pertinent family history.  Social History   Socioeconomic History  . Marital status: Single    Spouse name: Not on file  . Number of children: Not on file  . Years of education: Not on file  . Highest education level: Not on file  Occupational History  . Not on file  Social Needs  . Financial resource strain: Not on file  . Food insecurity:    Worry: Not on file    Inability: Not on file  . Transportation needs:    Medical: Not on file    Non-medical: Not on file  Tobacco Use  . Smoking status: Never Smoker  . Smokeless tobacco: Never Used  Substance and Sexual Activity  . Alcohol use: No  . Drug use: No  . Sexual activity: Not on file  Lifestyle  . Physical activity:    Days per week: Not on file    Minutes per session: Not on file  . Stress: Not on file  Relationships  . Social connections:    Talks on phone: Not on file    Gets together: Not on file    Attends religious service: Not on file    Active member of club or organization: Not on file    Attends meetings of clubs or organizations: Not on file    Relationship status: Not on file  . Intimate partner violence:    Fear of current or ex partner: Not on file    Emotionally abused: Not on file    Physically abused: Not on file    Forced sexual activity: Not on file  Other Topics Concern  . Not on file  Social History Narrative  . Not on file     Physical Exam  Vital Signs and Nursing Notes reviewed Vitals:   12/07/18 1317 12/07/18 1415  BP:  129/86  Pulse: 74 71  Resp:    Temp:    SpO2: 100% 100%    CONSTITUTIONAL: Well-appearing, NAD NEURO:  Alert and oriented x 3, no focal deficits EYES:  eyes equal and reactive ENT/NECK:  no LAD, no JVD CARDIO: Regular rate, well-perfused, normal S1 and S2 PULM:  CTAB no wheezing or rhonchi GI/GU:  normal bowel sounds, non-distended, non-tender; mild tenderness palpation to the left testicle, horizontal lie, intact cremasteric reflex bilaterally MSK/SPINE:  No gross deformities, no edema SKIN:  no rash, atraumatic PSYCH:  Appropriate speech and behavior  Diagnostic and Interventional Summary    Labs Reviewed  URINALYSIS, ROUTINE W REFLEX MICROSCOPIC - Abnormal; Notable for the following components:      Result Value   Color, Urine STRAW (*)    All other components within normal limits  GC/CHLAMYDIA PROBE AMP ([place]) NOT AT [facility]    US SCROTUM DOPPLER  Final Result      Medications  HYDROcodone-acetaminophen (NORCO/VICODIN) 5-325 MG per tablet 1 tablet (1 tablet Oral Given 12/07/18 1317)  cefTRIAXone (ROCEPHIN) injection 250 mg (250 mg Intramuscular Given 12/07/18 1457)  doxycycline (VIBRA-TABS) tablet 100 mg (100 mg Oral Given 12/07/18 1457)     Procedures Critical Care  ED Course and Medical Decision Making  I have reviewed the triage vital signs and the nursing notes.  Pertinent  labs & imaging results that were available during my care of the patient were reviewed by me and considered in my medical decision making (see below for details).  Ultrasound to exclude testicular torsion, however favoring epididymitis or orchitis.  Ultrasound without evidence of torsion, epididymal cyst but no obvious acute pathology or active infection.  Given patient's possible STD, will empirically treat with Doxy.  Given contact information for urology if not improving.  After the discussed management above, the patient was determined to be safe for discharge.  The patient was in agreement with this plan and all questions regarding their care were answered.  ED return precautions were discussed and the patient will return to the ED with any significant worsening of condition.  Elmer Sow. Pilar Plate, MD [facility] Emergency Medicine [facility] [email or web address]  Final Clinical Impressions(s) / ED Diagnoses     ICD-10-CM   1. Testicular pain N50.819 US SCROTUM DOPPLER    US SCROTUM DOPPLER  2. Epididymitis N45.1   3. Epididymal cyst N50.3   4. Varicocele I86.1     ED Discharge Orders         Ordered    doxycycline (VIBRAMYCIN) 100 MG capsule  2 times daily     12/07/18 1438             Sabas Sous, MD 12/07/18 1921

## 2018-12-07 NOTE — ED Notes (Signed)
Report given to Yellow RN

## 2018-12-07 NOTE — Discharge Instructions (Addendum)
Please take antibiotics as directed.  If testicular pain is not improving please follow-up with urology for further evaluation.  Your ultrasound today did show a very small cyst on your left epididymis and some fluid collections.  You have gonorrhea and chlamydia testing pending you will be called by phone if either of these results are positive if so please notify any partners so they can be tested and treated as well.

## 2018-12-07 NOTE — ED Triage Notes (Signed)
PT reports starting last night pain started on lt side of testicles with swelling. Pt denies any injury or drainage from penis.

## 2018-12-07 NOTE — ED Notes (Signed)
Patient transported to Ultrasound 

## 2018-12-07 NOTE — ED Provider Notes (Signed)
Care assumed from Dr. Kennis Carina at handoff as patient was transferred to yellow zone, please see his note for full details, but in brief Nicholas Spears is a 28 y.o. male who presents for left-sided testicular pain starting last night.  History of prior STDs.  Ultrasound ordered to rule out torsion, suspect epididymitis or orchitis.  Urinalysis as well as GC and chlamydia collected as well.  Plan: Follow-up on ultrasound, if no signs of torsion will plan to treat for epididymitis with urology follow-up.  Anticipate discharge.  Physical Exam  BP (!) 132/96   Pulse 74   Temp 98.5 F (36.9 C) (Oral)   Resp 16   Ht 5\' 9"  (1.753 m)   Wt 86.2 kg   SpO2 100%   BMI 28.06 kg/m   Physical Exam Vitals signs and nursing note reviewed.  Constitutional:      General: He is not in acute distress.    Appearance: He is well-developed. He is not diaphoretic.  HENT:     Head: Normocephalic and atraumatic.  Eyes:     General:        Right eye: No discharge.        Left eye: No discharge.  Pulmonary:     Effort: Pulmonary effort is normal. No respiratory distress.  Genitourinary:    Comments: GU exam per previous provider. Neurological:     Mental Status: He is alert.     Coordination: Coordination normal.  Psychiatric:        Behavior: Behavior normal.     ED Course/Procedures   Labs Reviewed  URINALYSIS, ROUTINE W REFLEX MICROSCOPIC - Abnormal; Notable for the following components:      Result Value   Color, Urine STRAW (*)    All other components within normal limits  GC/CHLAMYDIA PROBE AMP ([place]) NOT AT [facility]   US Scrotum Doppler  Result Date: 12/07/2018 CLINICAL DATA:  Acute left testicular pain. EXAM: SCROTAL ULTRASOUND DOPPLER ULTRASOUND OF THE TESTICLES TECHNIQUE: Complete ultrasound examination of the testicles, epididymis, and other scrotal structures was performed. Color and spectral Doppler ultrasound were also utilized to evaluate blood flow to the testicles.  COMPARISON:  None. FINDINGS: Right testicle Measurements: 5.0 x 3.4 x 2.6 cm. No mass or microlithiasis visualized. Left testicle Measurements: 4.2 x 3.3 x 2.4 cm. No mass or microlithiasis visualized. Right epididymis:  Normal in size and appearance. Left epididymis:  3 mm cyst is noted. Hydrocele:  None visualized. 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 Pulsed Doppler interrogation of both testes demonstrates normal low resistance arterial and venous waveforms bilaterally. IMPRESSION: No evidence of testicular mass or torsion. Small left epididymal cyst. 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 Electronically Signed   By: Lupita Raider M.D.   On: 12/07/2018 13:34    Procedures  MDM   Patient presents with left-sided testicular pain that started last night.  Ultrasound showed no evidence of torsion.  There is a small left epididymal cyst and bilateral varicoceles noted.  Given testicular pain and history of prior STDs, will treat for presumed epididymitis with Rocephin and doxycycline and have patient follow-up with urology if symptoms are not improving.  I discussed this plan with the patient and he is in agreement.  He is aware he has STD testing pending will need to notify any partners of positive results.  Patient requests work note due to worsening testicular pain while at work and doing heavy lifting and this was provided.  Return precautions discussed.  Urology follow-up given.  Discharged home in good condition.   Final diagnoses:  Testicular pain  Epididymitis  Epididymal cyst  Varicocele   ED Discharge Orders         Ordered    doxycycline (VIBRAMYCIN) 100 MG capsule  2 times daily     12/07/18 1438                Dartha LodgeFord, Rontae Inglett N, [place] JerseyPA-C 12/07/18 1456    Sabas SousBero, Michael M, MD 12/08/18 [phone]

## 2018-12-07 NOTE — ED Notes (Signed)
Ultrasound notified of pt to return to 36 after scan

## 2018-12-08 LAB — GC/CHLAMYDIA PROBE AMP (~~LOC~~) NOT AT ARMC
Chlamydia: NEGATIVE
Neisseria Gonorrhea: NEGATIVE

## 2019-01-05 ENCOUNTER — Other Ambulatory Visit: Payer: Self-pay

## 2019-01-05 ENCOUNTER — Encounter (HOSPITAL_COMMUNITY): Payer: Self-pay | Admitting: Emergency Medicine

## 2019-01-05 ENCOUNTER — Emergency Department (HOSPITAL_COMMUNITY)
Admission: EM | Admit: 2019-01-05 | Discharge: 2019-01-05 | Disposition: A | Discharge status: Home / Self Care | Payer: Self-pay | Attending: Emergency Medicine | Admitting: Emergency Medicine

## 2019-01-05 DIAGNOSIS — J45909 Unspecified asthma, uncomplicated: Secondary | ICD-10-CM | POA: Insufficient documentation

## 2019-01-05 DIAGNOSIS — Y990 Civilian activity done for income or pay: Secondary | ICD-10-CM | POA: Insufficient documentation

## 2019-01-05 DIAGNOSIS — Y929 Unspecified place or not applicable: Secondary | ICD-10-CM | POA: Insufficient documentation

## 2019-01-05 DIAGNOSIS — Z23 Encounter for immunization: Secondary | ICD-10-CM | POA: Insufficient documentation

## 2019-01-05 DIAGNOSIS — S91332A Puncture wound without foreign body, left foot, initial encounter: Secondary | ICD-10-CM | POA: Insufficient documentation

## 2019-01-05 DIAGNOSIS — W450XXA Nail entering through skin, initial encounter: Secondary | ICD-10-CM | POA: Insufficient documentation

## 2019-01-05 DIAGNOSIS — Y9389 Activity, other specified: Secondary | ICD-10-CM | POA: Insufficient documentation

## 2019-01-05 MED ORDER — TETANUS-DIPHTH-ACELL PERTUSSIS 5-2.5-18.5 LF-MCG/0.5 IM SUSP
0.5000 mL | Freq: Once | INTRAMUSCULAR | Status: AC
  Administered 2019-01-05: 0.5 mL via INTRAMUSCULAR
  Filled 2019-01-05: qty 0.5

## 2019-01-05 NOTE — Discharge Instructions (Addendum)
Please read attached information. If you experience any new or worsening signs or symptoms please return to the emergency room for evaluation. Please follow-up with your primary care provider or specialist as discussed.  °

## 2019-01-05 NOTE — ED Provider Notes (Signed)
[facility] EMERGENCY DEPARTMENT Provider Note   CSN: [id number] Arrival date & time: 01/05/19  [id number]    History   Chief Complaint Chief Complaint  Patient presents with  . Foot Pain    HPI Nicholas Spears is a 28 y.o. male.     HPI   28 year old male presents today with a puncture wound.  Patient notes he was at work when he stepped on a nail.  He notes the nail was pulled out in its entirety.  He notes this of his left midfoot.  He notes pain originally no significant pain now.  No surrounding redness or discharge.  No chronic health conditions.  Uncertain when his last tetanus was.  Notes he was wearing a shoe at the time.  History reviewed. No pertinent past medical history.  Patient Active Problem List   Diagnosis Date Noted  . CONSTIPATION 01/14/2008  . 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 ABDOMINAL PAIN, RIGHT LOWER QUADRANT 01/14/2008  . 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 No pertinent surgical history.      Home Medications    Prior to Admission medications   Medication Sig Start Date End Date Taking? Authorizing Provider  Pseudoeph-Doxylamine-DM-APAP (NYQUIL PO) Take 2 tablets by mouth at bedtime as needed (cold symptoms).    [provider]  Pseudoephedrine-APAP-DM (DAYQUIL PO) Take 2 tablets by mouth every 8 (eight) hours as needed (cold symptoms).    [provider]    Family History No family history on file.  Social History Social History   Tobacco Use  . Smoking status: Never Smoker  . Smokeless tobacco: Never Used  Substance Use Topics  . Alcohol use: No  . Drug use: No     Allergies   Patient has no known allergies.   Review of Systems Review of Systems  All other systems reviewed and are negative.    Physical Exam Updated Vital Signs BP 126/81 (BP Location: Left Arm)   Pulse 75   Temp 98.6 F (37 C) (Oral)   Resp 16   SpO2 100%   Physical Exam Vitals signs and nursing note  reviewed.  Constitutional:      Appearance: He is well-developed.  HENT:     Head: Normocephalic and atraumatic.  Eyes:     General: No scleral icterus.       Right eye: No discharge.        Left eye: No discharge.     Conjunctiva/sclera: Conjunctivae normal.     Pupils: Pupils are equal, round, and reactive to light.  Neck:     Musculoskeletal: Normal range of motion.     Vascular: No JVD.     Trachea: No tracheal deviation.  Pulmonary:     Effort: Pulmonary effort is normal.     Breath sounds: No stridor.  Musculoskeletal:     Comments: Puncture noted to the left plantar midfoot, no significant tenderness, no discharge, surrounding redness  Neurological:     Mental Status: He is alert and oriented to person, place, and time.     Coordination: Coordination normal.  Psychiatric:        Behavior: Behavior normal.        Thought Content: Thought content normal.        Judgment: Judgment normal.      ED Treatments / Results  Labs (all labs ordered are listed, but only abnormal results are displayed) Labs Reviewed - No data to display  EKG None  Radiology  No results found.  Procedures Procedures (including critical care time)  Medications Ordered in ED Medications  Tdap (BOOSTRIX) injection 0.5 mL (has no administration in time range)     Initial Impression / Assessment and Plan / ED Course  I have reviewed the triage vital signs and the nursing notes.  Pertinent labs & imaging results that were available during my care of the patient were reviewed by me and considered in my medical decision making (see chart for details).        28 year old male presents today with puncture wound.  Tetanus updated.  Patient will soak wound with warm soapy water, keep clean, watch for signs of infection return immediately if any present.  He verbalized understanding and agreement to today's plan had no further questions or concerns.   Final Clinical Impressions(s) / ED  Diagnoses   Final diagnoses:  Puncture wound of left foot, initial encounter    ED Discharge Orders    None       Rosalio LoudHedges, Darnell Jeschke, PA-C 01/05/19 1115    Shaune PollackIsaacs, Cameron, MD 01/06/19 2018

## 2019-01-05 NOTE — ED Triage Notes (Signed)
Pt. Stated, I stepped on a nail this morning at work and they said to come get checked out.

## 2019-01-28 ENCOUNTER — Encounter (HOSPITAL_COMMUNITY): Payer: Self-pay | Admitting: Emergency Medicine

## 2019-01-28 ENCOUNTER — Emergency Department (HOSPITAL_COMMUNITY)
Admission: EM | Admit: 2019-01-28 | Discharge: 2019-01-28 | Disposition: A | Discharge status: Home / Self Care | Payer: Self-pay | Attending: Emergency Medicine | Admitting: Emergency Medicine

## 2019-01-28 DIAGNOSIS — J45909 Unspecified asthma, uncomplicated: Secondary | ICD-10-CM | POA: Insufficient documentation

## 2019-01-28 DIAGNOSIS — E860 Dehydration: Secondary | ICD-10-CM | POA: Insufficient documentation

## 2019-01-28 LAB — MAGNESIUM: Magnesium: 2.2 mg/dL (ref 1.7–2.4)

## 2019-01-28 LAB — CBC WITH DIFFERENTIAL/PLATELET
Abs Immature Granulocytes: 0.02 10*3/uL (ref 0.00–0.07)
Basophils Absolute: 0 10*3/uL (ref 0.0–0.1)
Basophils Relative: 1 %
Eosinophils Absolute: 0.1 10*3/uL (ref 0.0–0.5)
Eosinophils Relative: 2 %
HCT: 43.2 % (ref 39.0–52.0)
Hemoglobin: 14.4 g/dL (ref 13.0–17.0)
Immature Granulocytes: 0 %
Lymphocytes Relative: 34 %
Lymphs Abs: 2.2 10*3/uL (ref 0.7–4.0)
MCH: 29 pg (ref 26.0–34.0)
MCHC: 33.3 g/dL (ref 30.0–36.0)
MCV: 87.1 fL (ref 80.0–100.0)
Monocytes Absolute: 0.6 10*3/uL (ref 0.1–1.0)
Monocytes Relative: 10 %
Neutro Abs: 3.4 10*3/uL (ref 1.7–7.7)
Neutrophils Relative %: 53 %
Platelets: 192 10*3/uL (ref 150–400)
RBC: 4.96 MIL/uL (ref 4.22–5.81)
RDW: 12.5 % (ref 11.5–15.5)
WBC: 6.4 10*3/uL (ref 4.0–10.5)
nRBC: 0 % (ref 0.0–0.2)

## 2019-01-28 LAB — COMPREHENSIVE METABOLIC PANEL
ALT: 17 U/L (ref 0–44)
AST: 20 U/L (ref 15–41)
Albumin: 3.9 g/dL (ref 3.5–5.0)
Alkaline Phosphatase: 52 U/L (ref 38–126)
Anion gap: 8 (ref 5–15)
BUN: 10 mg/dL (ref 6–20)
CO2: 25 mmol/L (ref 22–32)
Calcium: 9.2 mg/dL (ref 8.9–10.3)
Chloride: 106 mmol/L (ref 98–111)
Creatinine, Ser: 1.05 mg/dL (ref 0.61–1.24)
GFR calc Af Amer: >60 mL/min (ref >60)
GFR calc non Af Amer: >60 mL/min (ref >60)
Glucose, Bld: 102 mg/dL — ABNORMAL HIGH (ref 70–99)
Potassium: 4 mmol/L (ref 3.5–5.1)
Sodium: 139 mmol/L (ref 135–145)
Total Bilirubin: 0.5 mg/dL (ref 0.3–1.2)
Total Protein: 7 g/dL (ref 6.5–8.1)

## 2019-01-28 MED ORDER — SODIUM CHLORIDE 0.9 % IV BOLUS
1500.0000 mL | Freq: Once | INTRAVENOUS | Status: AC
  Administered 2019-01-28: 1500 mL via INTRAVENOUS

## 2019-01-28 MED ORDER — ONDANSETRON HCL 4 MG/2ML IJ SOLN
4.0000 mg | Freq: Once | INTRAMUSCULAR | Status: AC
  Administered 2019-01-28: 4 mg via INTRAVENOUS
  Filled 2019-01-28: qty 2

## 2019-01-28 MED ORDER — ONDANSETRON 8 MG PO TBDP: 8.0000 mg | ORAL_TABLET | Freq: Three times a day (TID) | ORAL | 0 refills | Status: AC | PRN

## 2019-01-28 NOTE — ED Notes (Signed)
Patient verbalized understanding of dc instructions, vss, ambulatory with nad.   

## 2019-01-28 NOTE — ED Provider Notes (Signed)
[facility] Provider Note   CSN: [id number] Arrival date & time: 01/28/19  [id number]     History   Chief Complaint Chief Complaint  Patient presents with  . Nausea    HPI Nicholas Spears is a 28 y.o. male.     HPI Patient is a 28 year old male presents emergency department with approximately 5 days of nausea vomiting diarrhea.  His daughter is home sick with the same symptoms.  Denies blood in his vomit or stool.  No fevers or chills.  No chest pain or shortness of breath.  No known COVID-19 contacts.  Denies focused abdominal pain.  Symptoms are mild to moderate in severity.  Feels slightly weak and lightheaded.   History reviewed. No pertinent past medical history.  Patient Active Problem List   Diagnosis Date Noted  . CONSTIPATION 01/14/2008  . 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 ABDOMINAL PAIN, RIGHT LOWER QUADRANT 01/14/2008  . 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 No pertinent surgical history.      Home Medications    Prior to Admission medications   Medication Sig Start Date End Date Taking? Authorizing Provider  ondansetron (ZOFRAN ODT) 8 MG disintegrating tablet Take 1 tablet (8 mg total) by mouth every 8 (eight) hours as needed for nausea or vomiting. 01/28/19   Azalia Bilisampos, Rifka Ramey, MD  Pseudoeph-Doxylamine-DM-APAP (NYQUIL PO) Take 2 tablets by mouth at bedtime as needed (cold symptoms).    [provider]  Pseudoephedrine-APAP-DM (DAYQUIL PO) Take 2 tablets by mouth every 8 (eight) hours as needed (cold symptoms).    [provider]    Family History History reviewed. No pertinent family history.  Social History Social History   Tobacco Use  . Smoking status: Never Smoker  . Smokeless tobacco: Never Used  Substance Use Topics  . Alcohol use: No  . Drug use: No     Allergies   Patient has no known allergies.   Review of Systems Review of Systems  All other systems reviewed and are  negative.    Physical Exam Updated Vital Signs BP 113/80   Pulse 67   Temp 98.1 F (36.7 C) (Oral)   Resp 18   SpO2 100%   Physical Exam Vitals signs and nursing note reviewed.  Constitutional:      Appearance: He is well-developed.  HENT:     Head: Normocephalic and atraumatic.  Neck:     Musculoskeletal: Normal range of motion.  Cardiovascular:     Rate and Rhythm: Normal rate and regular rhythm.     Heart sounds: Normal heart sounds.  Pulmonary:     Effort: Pulmonary effort is normal. No respiratory distress.     Breath sounds: Normal breath sounds.  Abdominal:     General: There is no distension.     Palpations: Abdomen is soft.     Tenderness: There is no abdominal tenderness.  Musculoskeletal: Normal range of motion.  Skin:    General: Skin is warm and dry.  Neurological:     Mental Status: He is alert and oriented to person, place, and time.  Psychiatric:        Judgment: Judgment normal.      ED Treatments / Results  Labs (all labs ordered are listed, but only abnormal results are displayed) Labs Reviewed  COMPREHENSIVE METABOLIC PANEL - Abnormal; Notable for the following components:      Result Value   Glucose, Bld 102 (*)    All other components within  normal limits  CBC WITH DIFFERENTIAL/PLATELET  MAGNESIUM    EKG    Radiology No results found.  Procedures Procedures (including critical care time)  Medications Ordered in ED Medications  sodium chloride 0.9 % bolus 1,500 mL (1,500 mLs Intravenous New Bag/Given 01/28/19 0739)  ondansetron (ZOFRAN) injection 4 mg (4 mg Intravenous Given 01/28/19 0739)     Initial Impression / Assessment and Plan / ED Course  I have reviewed the triage vital signs and the nursing notes.  Pertinent labs & imaging results that were available during my care of the patient were reviewed by me and considered in my medical decision making (see chart for details).        Feels much better after IV fluids.   Likely acute dehydration in the setting of viral GI illness.  Home with a prescription for Zofran.  Return precautions given.  Patient understands return to the ER for new or worsening symptoms.  Ongoing oral hydration at home.  Final Clinical Impressions(s) / ED Diagnoses   Final diagnoses:  Acute dehydration    ED Discharge Orders         Ordered    ondansetron (ZOFRAN ODT) 8 MG disintegrating tablet  Every 8 hours PRN     01/28/19 0958           Jola Schmidt, MD 01/28/19 1001

## 2019-01-28 NOTE — ED Triage Notes (Signed)
Pt here from home with c/o nausea and vomiting since last Sunday , daughter has also had a stomach bug

## 2019-10-13 ENCOUNTER — Other Ambulatory Visit: Payer: Self-pay | Admitting: Emergency Medicine

## 2019-10-13 DIAGNOSIS — S6982XA Other specified injuries of left wrist, hand and finger(s), initial encounter: Secondary | ICD-10-CM

## 2019-10-24 ENCOUNTER — Ambulatory Visit
Admission: RE | Admit: 2019-10-24 | Discharge: 2019-10-24 | Disposition: A | Discharge status: Home / Self Care | Payer: Worker's Compensation | Source: Ambulatory Visit | Attending: Emergency Medicine | Admitting: Emergency Medicine

## 2019-10-24 ENCOUNTER — Other Ambulatory Visit: Payer: Self-pay

## 2019-10-24 DIAGNOSIS — S6982XA Other specified injuries of left wrist, hand and finger(s), initial encounter: Secondary | ICD-10-CM

## 2019-10-24 IMAGING — MR MR WRIST*L* W/O CM
6 series · 40 of 40 positions shown · non-contrast
Comparison: None.

CLINICAL DATA: Left wrist pain.  Possible repetitive motion injury.

EXAM:
MR OF THE LEFT WRIST WITHOUT CONTRAST
TECHNIQUE: Multiplanar, multisequence MR imaging of the left wrist was
performed. No intravenous contrast was administered.

[Series 5: T2 fat-sat · axial · left · 3.0mm · 0.31mm/px · z∈[-31,+47]mm · 9 of 25 slices shown (1 of 2)]
[im 1/25]
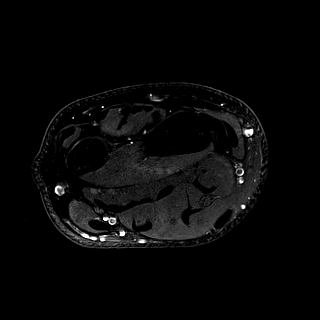
[im 4/25]
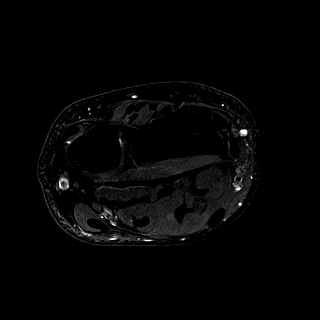
[im 7/25]
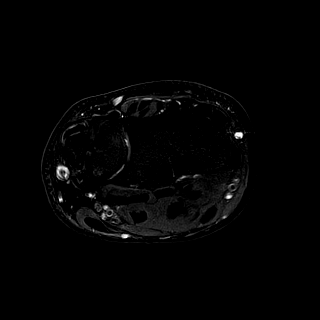
[im 10/25]
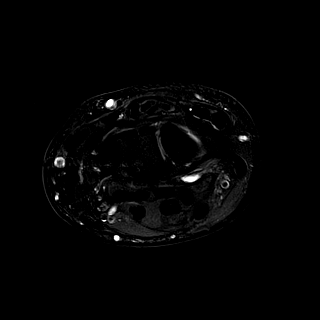
[im 13/25]
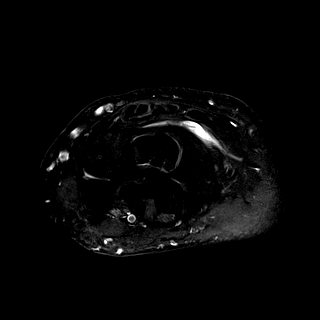
[im 16/25]
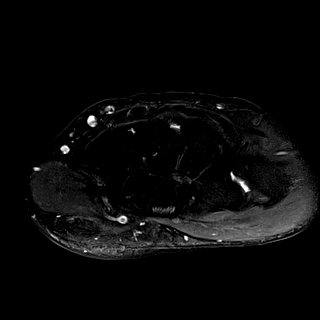
[im 19/25]
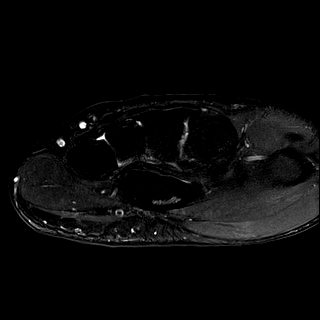
[im 22/25]
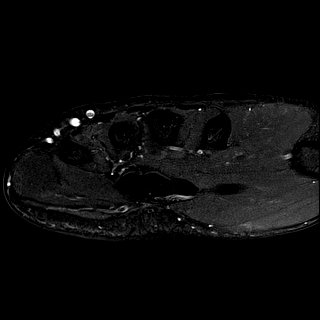
[im 25/25]
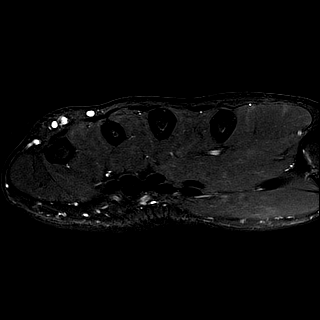

[Series 6: T1 · axial · left · 3.0mm · 0.31mm/px · z∈[-31,+47]mm · 9 of 25 slices shown (1 of 2)]
[im 1/25]
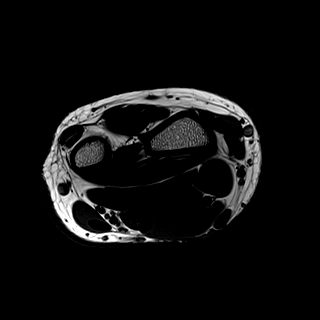
[im 4/25]
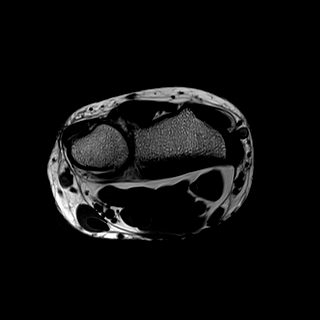
[im 7/25]
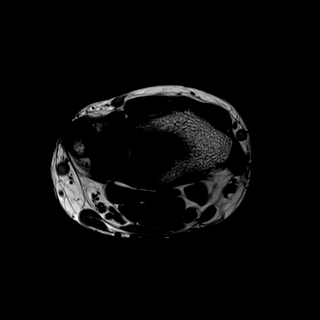
[im 10/25]
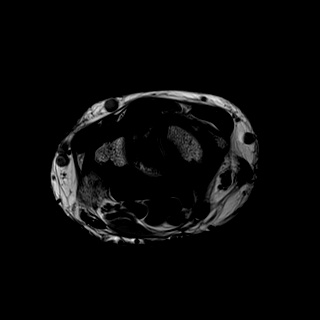
[im 13/25]
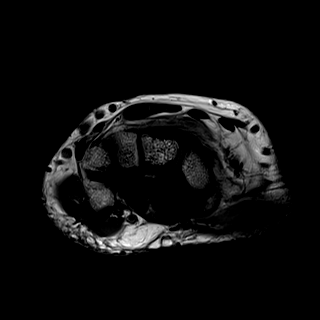
[im 16/25]
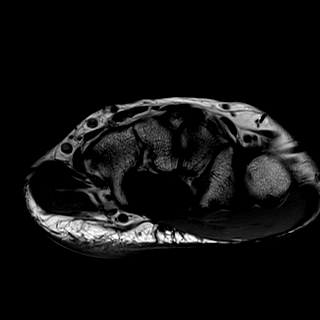
[im 19/25]
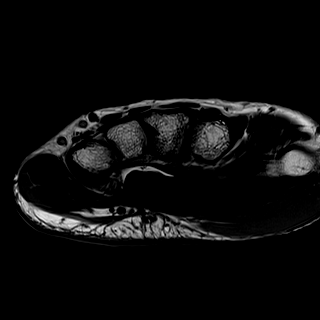
[im 22/25]
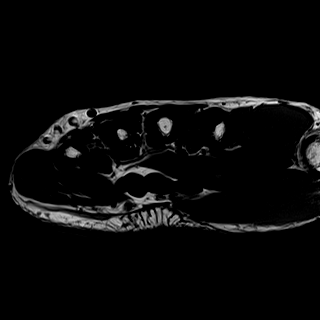
[im 25/25]
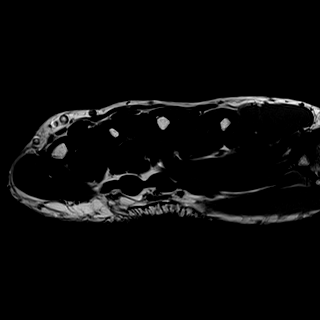

[Series 7: T1 · coronal · left · 3.0mm · 0.29mm/px · 5 of 16 slices shown (2 of 2)]
[im 1/16]
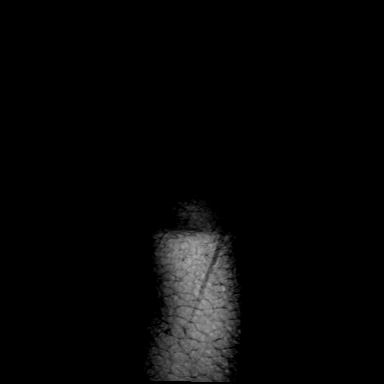
[im 4/16]
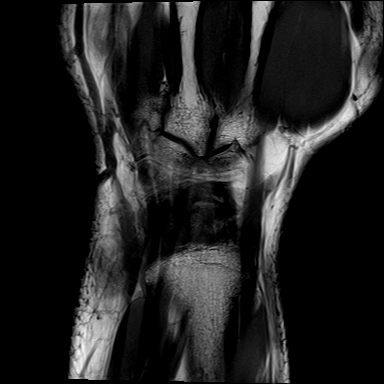
[im 8/16]
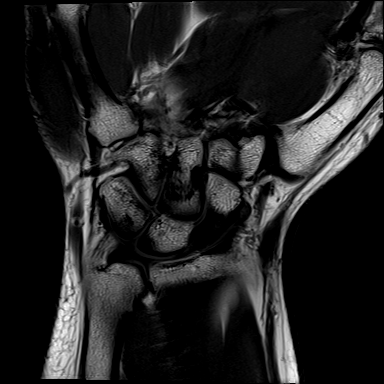
[im 12/16]
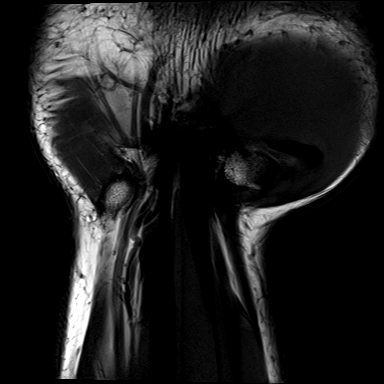
[im 16/16]
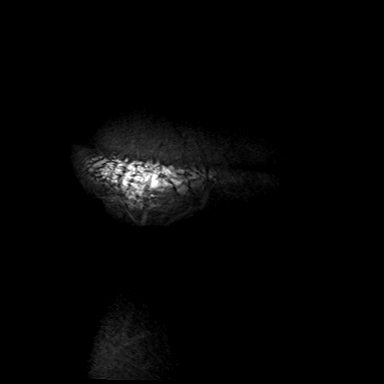

[Series 8: T2 fat-sat · coronal · left · 3.0mm · 0.34mm/px · 5 of 16 slices shown (2 of 2)]
[im 1/16]
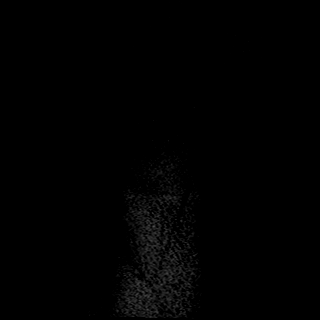
[im 4/16]
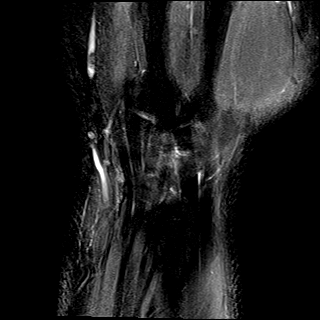
[im 8/16]
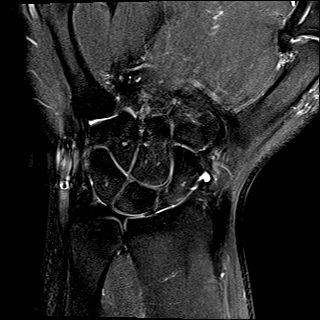
[im 12/16]
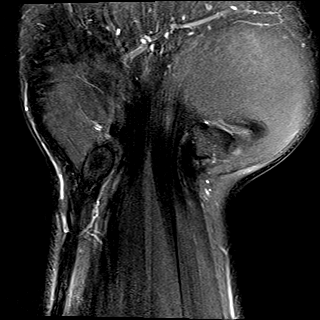
[im 16/16]
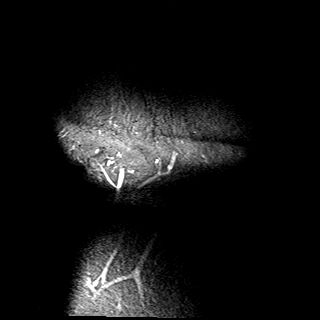

[Series 9: PD fat-sat · coronal · left · 3.0mm · 0.34mm/px · 5 of 16 slices shown (1 of 2)]
[im 1/16]
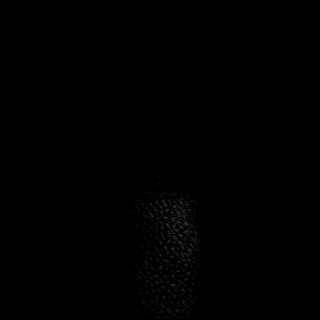
[im 4/16]
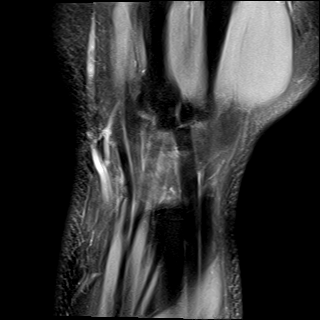
[im 8/16]
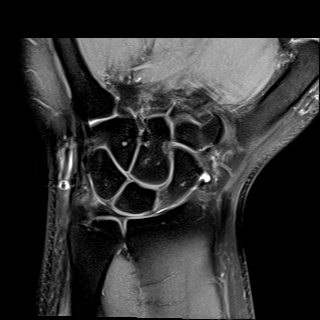
[im 12/16]
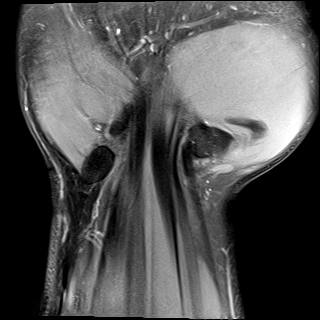
[im 16/16]
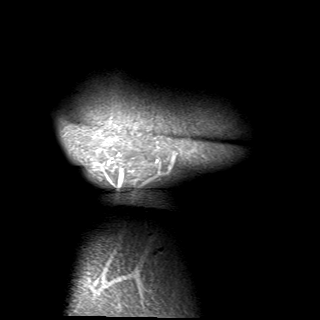

[Series 10: PD fat-sat · sagittal · left · 3.0mm · 0.31mm/px · 7 of 21 slices shown (2 of 2)]
[im 1/21]
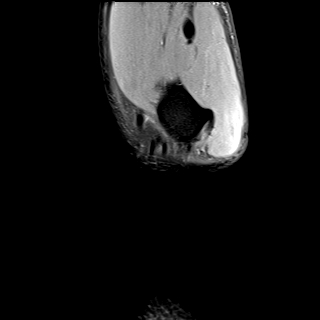
[im 4/21]
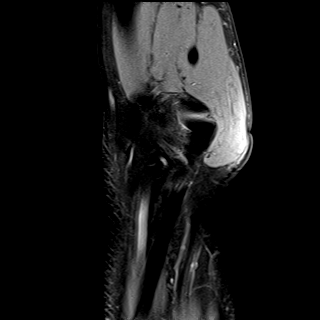
[im 7/21]
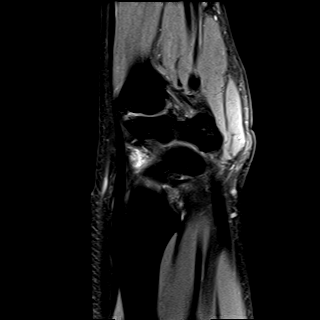
[im 11/21]
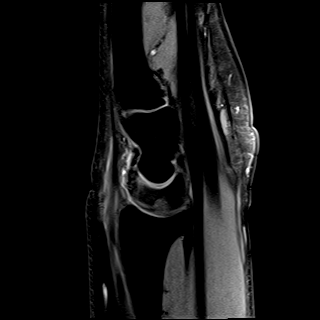
[im 14/21]
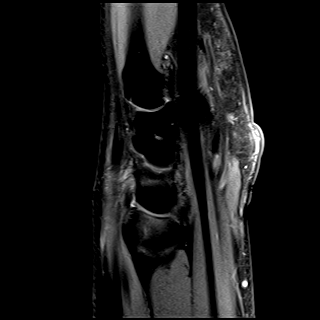
[im 17/21]
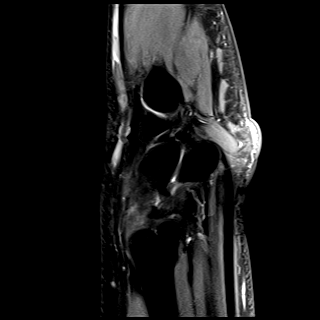
[im 21/21]
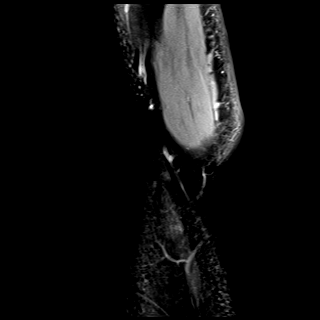

[40 of 40 positions shown; findings below may reference images not displayed]

FINDINGS: Ligaments: Intact.

Triangular fibrocartilage: Intact.

Tendons: Normal.

Carpal tunnel/median nerve: Normal.

Guyon's canal: Normal.

Joint/cartilage: Normal.

Bones/carpal alignment: Normal.

Other: A small cyst contiguous in the volar soft tissues contiguous
with the radial styloid measures 0.5 cm craniocaudal by 0.5 cm
transverse by 0.3 cm AP. There is a small amount of fluid the
radiocarpal joint.
IMPRESSION: Intact ligaments, TFC and tendons.  No bony abnormality.

Tiny cyst in the volar soft tissues contiguous of the distal radius
is compatible with ganglion.

## 2020-12-20 ENCOUNTER — Emergency Department (HOSPITAL_COMMUNITY): Payer: Worker's Compensation

## 2020-12-20 ENCOUNTER — Encounter (HOSPITAL_COMMUNITY): Payer: Self-pay

## 2020-12-20 ENCOUNTER — Emergency Department (HOSPITAL_COMMUNITY)
Admission: EM | Admit: 2020-12-20 | Discharge: 2020-12-20 | Disposition: A | Discharge status: Home / Self Care | Payer: Worker's Compensation | Attending: Emergency Medicine | Admitting: Emergency Medicine

## 2020-12-20 DIAGNOSIS — S199XXA Unspecified injury of neck, initial encounter: Secondary | ICD-10-CM | POA: Diagnosis present

## 2020-12-20 DIAGNOSIS — Y92410 Unspecified street and highway as the place of occurrence of the external cause: Secondary | ICD-10-CM | POA: Diagnosis not present

## 2020-12-20 DIAGNOSIS — S40011A Contusion of right shoulder, initial encounter: Secondary | ICD-10-CM | POA: Insufficient documentation

## 2020-12-20 DIAGNOSIS — M79642 Pain in left hand: Secondary | ICD-10-CM | POA: Diagnosis not present

## 2020-12-20 DIAGNOSIS — R519 Headache, unspecified: Secondary | ICD-10-CM | POA: Insufficient documentation

## 2020-12-20 DIAGNOSIS — S161XXA Strain of muscle, fascia and tendon at neck level, initial encounter: Secondary | ICD-10-CM | POA: Insufficient documentation

## 2020-12-20 DIAGNOSIS — R42 Dizziness and giddiness: Secondary | ICD-10-CM | POA: Diagnosis not present

## 2020-12-20 DIAGNOSIS — S60212A Contusion of left wrist, initial encounter: Secondary | ICD-10-CM | POA: Insufficient documentation
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
## 2020-12-20 MED ORDER — KETOROLAC TROMETHAMINE 60 MG/2ML IM SOLN
60.0000 mg | Freq: Once | INTRAMUSCULAR | Status: AC
  Administered 2020-12-20: 60 mg via INTRAMUSCULAR
  Filled 2020-12-20: qty 2
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
## 2020-12-20 NOTE — Discharge Instructions (Signed)
Your x-rays of the head and the neck as well as the right shoulder left wrist and left hand are all normal.  You may take Mobic twice a day as needed for pain, use ice or heat alternating to help with some of the pain, be aware that you may be uncomfortable for a week or longer but there are no broken bones and you should be able to go back to work on Monday.  You will need to follow-up with your family doctor if you are not improving, return to the emergency department for severe worsening symptoms
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
## 2020-12-20 NOTE — ED Notes (Signed)
Patient in CT at this time.

## 2021-02-19 ENCOUNTER — Other Ambulatory Visit: Payer: Self-pay | Admitting: Orthopedic Surgery

## 2021-02-19 DIAGNOSIS — M542 Cervicalgia: Secondary | ICD-10-CM

## 2021-02-20 ENCOUNTER — Other Ambulatory Visit: Payer: Self-pay | Admitting: Orthopedic Surgery

## 2021-02-20 DIAGNOSIS — M25511 Pain in right shoulder: Secondary | ICD-10-CM

## 2021-02-28 ENCOUNTER — Other Ambulatory Visit: Payer: Self-pay

## 2021-03-01 ENCOUNTER — Ambulatory Visit
Admission: RE | Admit: 2021-03-01 | Discharge: 2021-03-01 | Disposition: A | Discharge status: Home / Self Care | Payer: Worker's Compensation | Source: Ambulatory Visit | Attending: Orthopedic Surgery | Admitting: Orthopedic Surgery

## 2021-03-01 ENCOUNTER — Other Ambulatory Visit: Payer: Self-pay

## 2021-03-01 DIAGNOSIS — M25511 Pain in right shoulder: Secondary | ICD-10-CM

## 2021-03-01 DIAGNOSIS — M542 Cervicalgia: Secondary | ICD-10-CM
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
## 2022-02-06 ENCOUNTER — Encounter (HOSPITAL_COMMUNITY): Payer: Self-pay | Admitting: Pharmacy Technician

## 2022-02-06 ENCOUNTER — Emergency Department (HOSPITAL_COMMUNITY)
Admission: EM | Admit: 2022-02-06 | Discharge: 2022-02-06 | Disposition: A | Discharge status: Home / Self Care | Payer: No Typology Code available for payment source | Attending: Emergency Medicine | Admitting: Emergency Medicine

## 2022-02-06 ENCOUNTER — Emergency Department (HOSPITAL_COMMUNITY): Payer: No Typology Code available for payment source

## 2022-02-06 DIAGNOSIS — S161XXA Strain of muscle, fascia and tendon at neck level, initial encounter: Secondary | ICD-10-CM | POA: Insufficient documentation

## 2022-02-06 DIAGNOSIS — S199XXA Unspecified injury of neck, initial encounter: Secondary | ICD-10-CM | POA: Diagnosis present

## 2022-02-06 DIAGNOSIS — Y92410 Unspecified street and highway as the place of occurrence of the external cause: Secondary | ICD-10-CM | POA: Diagnosis not present
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
## 2022-02-06 NOTE — ED Notes (Signed)
ED Provider at bedside. 

## 2022-02-06 NOTE — ED Notes (Signed)
Discharge instructions provided to family. Voiced understanding. No questions at this time. Pt alert and oriented x 4. Ambulatory without difficulty noted.   
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
## 2022-02-06 NOTE — Discharge Instructions (Signed)
Your CT scan did not show any broken bones. Use Tylenol every 4 hours and ibuprofen every 6 as needed for pain. Use heat packs as needed for muscle tightness. Return for weakness, numbness, uncontrolled pain or new concerns.

## 2023-01-08 ENCOUNTER — Emergency Department (HOSPITAL_COMMUNITY)
Admission: EM | Admit: 2023-01-08 | Discharge: 2023-01-09 | Disposition: A | Discharge status: Home / Self Care | Payer: Self-pay | Attending: Emergency Medicine | Admitting: Emergency Medicine

## 2023-01-08 ENCOUNTER — Other Ambulatory Visit: Payer: Self-pay

## 2023-01-08 ENCOUNTER — Encounter (HOSPITAL_COMMUNITY): Payer: Self-pay | Admitting: *Deleted

## 2023-01-08 DIAGNOSIS — S39012A Strain of muscle, fascia and tendon of lower back, initial encounter: Secondary | ICD-10-CM

## 2023-01-08 DIAGNOSIS — X500XXA Overexertion from strenuous movement or load, initial encounter: Secondary | ICD-10-CM | POA: Insufficient documentation

## 2023-01-08 LAB — CBC
HCT: 43 % (ref 39.0–52.0)
Hemoglobin: 14.3 g/dL (ref 13.0–17.0)
MCH: 29.1 pg (ref 26.0–34.0)
MCHC: 33.3 g/dL (ref 30.0–36.0)
MCV: 87.4 fL (ref 80.0–100.0)
Platelets: 224 10*3/uL (ref 150–400)
RBC: 4.92 MIL/uL (ref 4.22–5.81)
RDW: 12.8 % (ref 11.5–15.5)
WBC: 8.9 10*3/uL (ref 4.0–10.5)
nRBC: 0 % (ref 0.0–0.2)

## 2023-01-08 LAB — URINALYSIS, ROUTINE W REFLEX MICROSCOPIC
Bilirubin Urine: NEGATIVE
Glucose, UA: NEGATIVE mg/dL
Hgb urine dipstick: NEGATIVE
Ketones, ur: 5 mg/dL — AB
Leukocytes,Ua: NEGATIVE
Nitrite: NEGATIVE
Protein, ur: NEGATIVE mg/dL
Specific Gravity, Urine: 1.028 (ref 1.005–1.030)
pH: 5 (ref 5.0–8.0)

## 2023-01-08 LAB — COMPREHENSIVE METABOLIC PANEL
ALT: 19 U/L (ref 0–44)
AST: 23 U/L (ref 15–41)
Albumin: 4.1 g/dL (ref 3.5–5.0)
Alkaline Phosphatase: 54 U/L (ref 38–126)
Anion gap: 8 (ref 5–15)
BUN: 11 mg/dL (ref 6–20)
CO2: 26 mmol/L (ref 22–32)
Calcium: 8.9 mg/dL (ref 8.9–10.3)
Chloride: 103 mmol/L (ref 98–111)
Creatinine, Ser: 1.15 mg/dL (ref 0.61–1.24)
GFR, Estimated: >60 mL/min (ref >60)
Glucose, Bld: 80 mg/dL (ref 70–99)
Potassium: 4.1 mmol/L (ref 3.5–5.1)
Sodium: 137 mmol/L (ref 135–145)
Total Bilirubin: 0.5 mg/dL (ref 0.3–1.2)
Total Protein: 7.2 g/dL (ref 6.5–8.1)

## 2023-01-08 LAB — LIPASE, BLOOD: Lipase: 33 U/L (ref 11–51)

## 2023-01-08 MED ORDER — OXYCODONE-ACETAMINOPHEN 5-325 MG PO TABS
1.0000 | ORAL_TABLET | Freq: Once | ORAL | Status: AC
  Administered 2023-01-08: 1 via ORAL
  Filled 2023-01-08: qty 1

## 2023-01-08 NOTE — ED Triage Notes (Signed)
Lower back pain since last pm no bloody urine no urinary frequency   no known injury

## 2023-01-09 MED ORDER — METHYLPREDNISOLONE ACETATE 40 MG/ML IJ SUSP
40.0000 mg | Freq: Once | INTRAMUSCULAR | Status: AC
  Administered 2023-01-09: 40 mg via INTRAMUSCULAR
  Filled 2023-01-09: qty 1

## 2023-01-09 MED ORDER — KETOROLAC TROMETHAMINE 15 MG/ML IJ SOLN
15.0000 mg | Freq: Once | INTRAMUSCULAR | Status: AC
  Administered 2023-01-09: 15 mg via INTRAMUSCULAR
  Filled 2023-01-09: qty 1

## 2023-01-09 MED ORDER — METHOCARBAMOL 500 MG PO TABS: 500.0000 mg | ORAL_TABLET | Freq: Two times a day (BID) | ORAL | 0 refills | Status: AC

## 2023-01-09 MED ORDER — DICLOFENAC SODIUM 50 MG PO TBEC: 50.0000 mg | DELAYED_RELEASE_TABLET | Freq: Two times a day (BID) | ORAL | 0 refills | Status: AC

## 2023-01-09 NOTE — ED Provider Notes (Signed)
[name]MENT AT [facility] Provider Note   CSN: [id number] Arrival date & time: 01/08/23  1749     History  Chief Complaint  Patient presents with   Back Pain    Nicholas Spears is a 32 y.o. male.  32 year old man with no significant past medical history presents with complaint of left lower back pain.  Notes that he works 14-hour shifts, wears a back brace as per work protocol as he does a lot of lifting and twisting throughout the day.  Notes that he developed left lower back pain after work, radiates up the left side of his back, is worse with movement.  Has tried Tylenol and heating pad, massage, no significant relief today and had to miss work which prompted him to come to the emergency room.  He denies loss of bowel or bladder control, saddle paresthesias, abdominal pain.  No other complaints or concerns today.       Home Medications Prior to Admission medications   Medication Sig Start Date End Date Taking? Authorizing Provider  diclofenac (VOLTAREN) 50 MG EC tablet Take 1 tablet (50 mg total) by mouth 2 (two) times daily for 10 days. 01/09/23 01/19/23 Yes Jeannie Fend, PA-C  methocarbamol (ROBAXIN) 500 MG tablet Take 1 tablet (500 mg total) by mouth 2 (two) times daily. 01/09/23  Yes Jeannie Fend, PA-C  ondansetron (ZOFRAN ODT) 8 MG disintegrating tablet Take 1 tablet (8 mg total) by mouth every 8 (eight) hours as needed for nausea or vomiting. 01/28/19   Azalia Bilis, MD  Pseudoeph-Doxylamine-DM-APAP (NYQUIL PO) Take 2 tablets by mouth at bedtime as needed (cold symptoms).    [provider]  Pseudoephedrine-APAP-DM (DAYQUIL PO) Take 2 tablets by mouth every 8 (eight) hours as needed (cold symptoms).    [provider]      Allergies    Patient has no known allergies.    Review of Systems   Review of Systems Negative except as per HPI Physical Exam Updated Vital Signs BP 118/74 (BP Location: Right Arm)   Pulse 64   Temp  97.9 F (36.6 C)   Resp 18   Ht 5\' 9"  (1.753 m)   Wt 86.2 kg   SpO2 99%   BMI 28.06 kg/m  Physical Exam Vitals and nursing note reviewed.  Constitutional:      General: He is not in acute distress.    Appearance: He is well-developed. He is not diaphoretic.  HENT:     Head: Normocephalic and atraumatic.  Cardiovascular:     Pulses: Normal pulses.  Pulmonary:     Effort: Pulmonary effort is normal.  Abdominal:     Palpations: Abdomen is soft.     Tenderness: There is no abdominal tenderness.  Musculoskeletal:        General: Tenderness present. No swelling, deformity or signs of injury.     Lumbar back: Positive right straight leg raise test. Negative left straight leg raise test.       Back:     Right lower leg: No edema.     Left lower leg: No edema.  Skin:    General: Skin is warm and dry.     Findings: No erythema or rash.  Neurological:     Mental Status: He is alert and oriented to person, place, and time.     Sensory: No sensory deficit.     Motor: No weakness.     Gait: Gait normal.  Deep Tendon Reflexes: Reflexes normal.  Psychiatric:        Behavior: Behavior normal.     ED Results / Procedures / Treatments   Labs (all labs ordered are listed, but only abnormal results are displayed) Labs Reviewed  URINALYSIS, ROUTINE W REFLEX MICROSCOPIC - Abnormal; Notable for the following components:      Result Value   Ketones, ur 5 (*)    All other components within normal limits  LIPASE, BLOOD  COMPREHENSIVE METABOLIC PANEL  CBC    EKG None  Radiology No results found.  Procedures Procedures    Medications Ordered in ED Medications  ketorolac (TORADOL) 15 MG/ML injection 15 mg (has no administration in time range)  methylPREDNISolone acetate (DEPO-MEDROL) injection 40 mg (has no administration in time range)  oxyCODONE-acetaminophen (PERCOCET/ROXICET) 5-325 MG per tablet 1 tablet (1 tablet Oral Given 01/08/23 1910)    ED Course/ Medical  Decision Making/ A&P                             Medical Decision Making  32 year old male with left lower back pain as above.  Pain is worse with sitting up in the bed, palpation upper L, lower T-spine area left side.  Worse with right leg extension, raise.  Abdomen is soft and nontender, reflexes and strength intact, sensation intact.  Labs reassuring including urinalysis, CBC, CMP and lipase.  Plan is to treat with Toradol and Depo-Medrol, suspect musculoskeletal.  Advised to discuss work-related injury with his employer.  Will discharge with NSAID, muscle relaxant.         Final Clinical Impression(s) / ED Diagnoses Final diagnoses:  Strain of lumbar region, initial encounter    Rx / DC Orders ED Discharge Orders          Ordered    methocarbamol (ROBAXIN) 500 MG tablet  2 times daily        01/09/23 0046    diclofenac (VOLTAREN) 50 MG EC tablet  2 times daily        01/09/23 0046              Jeannie Fend, PA-C 01/09/23 0047    Zadie Rhine, MD 01/09/23 [phone]

## 2023-01-09 NOTE — Discharge Instructions (Addendum)
Recheck with your primary care provider or discuss Worker's Comp. with your employer.  Can take Robaxin and diclofenac as prescribed.  Do not drive or operate machinery while taking Robaxin.  Can apply over-the-counter lidocaine patches.  Can also try warm compresses and follow with gentle stretching.
